# Patient Record
Sex: Female | Born: 1975 | Race: Black or African American | Hispanic: No | Marital: Married | Smoking: Former smoker
Health system: Southern US, Community
[De-identification: ages and names within clinical notes are randomized; demographics above are authoritative.]

## PROBLEM LIST (undated history)

## (undated) DIAGNOSIS — K5900 Constipation, unspecified: Secondary | ICD-10-CM

## (undated) DIAGNOSIS — F32A Depression, unspecified: Secondary | ICD-10-CM

## (undated) DIAGNOSIS — E538 Deficiency of other specified B group vitamins: Secondary | ICD-10-CM

## (undated) DIAGNOSIS — R519 Headache, unspecified: Secondary | ICD-10-CM

## (undated) DIAGNOSIS — R079 Chest pain, unspecified: Secondary | ICD-10-CM

## (undated) DIAGNOSIS — G43019 Migraine without aura, intractable, without status migrainosus: Secondary | ICD-10-CM

## (undated) DIAGNOSIS — T7840XA Allergy, unspecified, initial encounter: Secondary | ICD-10-CM

## (undated) DIAGNOSIS — R600 Localized edema: Secondary | ICD-10-CM

## (undated) DIAGNOSIS — M549 Dorsalgia, unspecified: Secondary | ICD-10-CM

## (undated) DIAGNOSIS — G8929 Other chronic pain: Secondary | ICD-10-CM

## (undated) DIAGNOSIS — E669 Obesity, unspecified: Secondary | ICD-10-CM

## (undated) DIAGNOSIS — F419 Anxiety disorder, unspecified: Secondary | ICD-10-CM

## (undated) DIAGNOSIS — F411 Generalized anxiety disorder: Secondary | ICD-10-CM

## (undated) DIAGNOSIS — G43909 Migraine, unspecified, not intractable, without status migrainosus: Secondary | ICD-10-CM

## (undated) HISTORY — DX: Generalized anxiety disorder: F41.1

## (undated) HISTORY — DX: Obesity, unspecified: E66.9

## (undated) HISTORY — DX: Migraine without aura, intractable, without status migrainosus: G43.019

## (undated) HISTORY — DX: Localized edema: R60.0

## (undated) HISTORY — DX: Depression, unspecified: F32.A

## (undated) HISTORY — DX: Constipation, unspecified: K59.00

## (undated) HISTORY — DX: Allergy, unspecified, initial encounter: T78.40XA

## (undated) HISTORY — DX: Dorsalgia, unspecified: M54.9

## (undated) HISTORY — DX: Headache, unspecified: R51.9

## (undated) HISTORY — DX: Deficiency of other specified B group vitamins: E53.8

## (undated) HISTORY — DX: Anxiety disorder, unspecified: F41.9

## (undated) HISTORY — DX: Other chronic pain: G89.29

## (undated) HISTORY — DX: Migraine, unspecified, not intractable, without status migrainosus: G43.909

## (undated) HISTORY — DX: Chest pain, unspecified: R07.9

---

## 1998-04-02 ENCOUNTER — Other Ambulatory Visit: Admission: RE | Admit: 1998-04-02 | Discharge: 1998-04-02 | Payer: Self-pay | Admitting: Obstetrics and Gynecology

## 1998-09-11 ENCOUNTER — Emergency Department (HOSPITAL_COMMUNITY): Admission: EM | Admit: 1998-09-11 | Discharge: 1998-09-11 | Payer: Self-pay | Admitting: Emergency Medicine

## 1998-09-11 ENCOUNTER — Encounter: Payer: Self-pay | Admitting: Emergency Medicine

## 1999-05-07 ENCOUNTER — Other Ambulatory Visit: Admission: RE | Admit: 1999-05-07 | Discharge: 1999-05-07 | Payer: Self-pay | Admitting: Obstetrics and Gynecology

## 1999-07-21 ENCOUNTER — Other Ambulatory Visit: Admission: RE | Admit: 1999-07-21 | Discharge: 1999-07-21 | Payer: Self-pay | Admitting: Obstetrics & Gynecology

## 1999-07-22 ENCOUNTER — Encounter (INDEPENDENT_AMBULATORY_CARE_PROVIDER_SITE_OTHER): Payer: Self-pay

## 1999-07-22 ENCOUNTER — Other Ambulatory Visit: Admission: RE | Admit: 1999-07-22 | Discharge: 1999-07-22 | Payer: Self-pay | Admitting: Obstetrics & Gynecology

## 2000-06-13 ENCOUNTER — Emergency Department (HOSPITAL_COMMUNITY): Admission: EM | Admit: 2000-06-13 | Discharge: 2000-06-13 | Payer: Self-pay | Admitting: Emergency Medicine

## 2000-06-13 ENCOUNTER — Encounter: Payer: Self-pay | Admitting: Emergency Medicine

## 2000-08-12 ENCOUNTER — Inpatient Hospital Stay (HOSPITAL_COMMUNITY): Admission: AD | Admit: 2000-08-12 | Discharge: 2000-08-12 | Payer: Self-pay | Admitting: Obstetrics & Gynecology

## 2001-10-12 ENCOUNTER — Other Ambulatory Visit: Admission: RE | Admit: 2001-10-12 | Discharge: 2001-10-12 | Payer: Self-pay | Admitting: Obstetrics and Gynecology

## 2002-01-12 ENCOUNTER — Other Ambulatory Visit: Admission: RE | Admit: 2002-01-12 | Discharge: 2002-01-12 | Payer: Self-pay | Admitting: Obstetrics and Gynecology

## 2002-02-27 ENCOUNTER — Encounter: Payer: Self-pay | Admitting: Obstetrics and Gynecology

## 2002-02-27 ENCOUNTER — Ambulatory Visit (HOSPITAL_COMMUNITY): Admission: RE | Admit: 2002-02-27 | Discharge: 2002-02-27 | Payer: Self-pay | Admitting: Obstetrics and Gynecology

## 2002-03-05 ENCOUNTER — Inpatient Hospital Stay (HOSPITAL_COMMUNITY): Admission: AD | Admit: 2002-03-05 | Discharge: 2002-03-05 | Payer: Self-pay | Admitting: Obstetrics and Gynecology

## 2002-03-05 ENCOUNTER — Encounter: Payer: Self-pay | Admitting: Obstetrics and Gynecology

## 2002-03-08 ENCOUNTER — Ambulatory Visit (HOSPITAL_COMMUNITY): Admission: RE | Admit: 2002-03-08 | Discharge: 2002-03-08 | Payer: Self-pay | Admitting: Obstetrics and Gynecology

## 2002-03-21 ENCOUNTER — Inpatient Hospital Stay (HOSPITAL_COMMUNITY): Admission: AD | Admit: 2002-03-21 | Discharge: 2002-03-21 | Payer: Self-pay | Admitting: Obstetrics and Gynecology

## 2002-03-22 ENCOUNTER — Ambulatory Visit (HOSPITAL_COMMUNITY): Admission: RE | Admit: 2002-03-22 | Discharge: 2002-03-22 | Payer: Self-pay | Admitting: Obstetrics and Gynecology

## 2002-03-22 ENCOUNTER — Encounter: Payer: Self-pay | Admitting: Obstetrics and Gynecology

## 2002-05-04 ENCOUNTER — Inpatient Hospital Stay (HOSPITAL_COMMUNITY): Admission: AD | Admit: 2002-05-04 | Discharge: 2002-05-04 | Payer: Self-pay | Admitting: *Deleted

## 2002-05-22 ENCOUNTER — Inpatient Hospital Stay (HOSPITAL_COMMUNITY): Admission: AD | Admit: 2002-05-22 | Discharge: 2002-05-22 | Payer: Self-pay | Admitting: Obstetrics and Gynecology

## 2002-05-23 ENCOUNTER — Inpatient Hospital Stay (HOSPITAL_COMMUNITY): Admission: AD | Admit: 2002-05-23 | Discharge: 2002-05-23 | Payer: Self-pay | Admitting: Obstetrics & Gynecology

## 2002-05-27 ENCOUNTER — Inpatient Hospital Stay (HOSPITAL_COMMUNITY): Admission: AD | Admit: 2002-05-27 | Discharge: 2002-05-27 | Payer: Self-pay | Admitting: Obstetrics and Gynecology

## 2002-06-13 ENCOUNTER — Inpatient Hospital Stay (HOSPITAL_COMMUNITY): Admission: AD | Admit: 2002-06-13 | Discharge: 2002-06-13 | Payer: Self-pay | Admitting: Obstetrics & Gynecology

## 2002-07-08 ENCOUNTER — Inpatient Hospital Stay (HOSPITAL_COMMUNITY): Admission: AD | Admit: 2002-07-08 | Discharge: 2002-07-08 | Payer: Self-pay | Admitting: Obstetrics and Gynecology

## 2002-07-11 ENCOUNTER — Inpatient Hospital Stay (HOSPITAL_COMMUNITY): Admission: AD | Admit: 2002-07-11 | Discharge: 2002-07-11 | Payer: Self-pay | Admitting: Obstetrics and Gynecology

## 2002-07-18 ENCOUNTER — Inpatient Hospital Stay (HOSPITAL_COMMUNITY): Admission: AD | Admit: 2002-07-18 | Discharge: 2002-07-21 | Payer: Self-pay | Admitting: Obstetrics and Gynecology

## 2002-07-22 ENCOUNTER — Encounter: Admission: RE | Admit: 2002-07-22 | Discharge: 2002-08-21 | Payer: Self-pay | Admitting: Obstetrics and Gynecology

## 2002-08-22 ENCOUNTER — Encounter: Admission: RE | Admit: 2002-08-22 | Discharge: 2002-09-21 | Payer: Self-pay | Admitting: *Deleted

## 2003-06-28 ENCOUNTER — Emergency Department (HOSPITAL_COMMUNITY): Admission: AD | Admit: 2003-06-28 | Discharge: 2003-06-28 | Payer: Self-pay | Admitting: Emergency Medicine

## 2006-05-28 ENCOUNTER — Ambulatory Visit: Payer: Self-pay | Admitting: Obstetrics & Gynecology

## 2006-05-28 ENCOUNTER — Observation Stay (HOSPITAL_COMMUNITY): Admission: AD | Admit: 2006-05-28 | Discharge: 2006-05-29 | Payer: Self-pay | Admitting: Obstetrics & Gynecology

## 2009-06-25 ENCOUNTER — Ambulatory Visit (HOSPITAL_COMMUNITY): Admission: RE | Admit: 2009-06-25 | Discharge: 2009-06-26 | Payer: Self-pay | Admitting: Obstetrics and Gynecology

## 2009-06-25 ENCOUNTER — Encounter (INDEPENDENT_AMBULATORY_CARE_PROVIDER_SITE_OTHER): Payer: Self-pay | Admitting: Obstetrics and Gynecology

## 2009-12-20 HISTORY — PX: LAPAROSCOPIC TOTAL HYSTERECTOMY: SUR800

## 2009-12-20 HISTORY — PX: LAPAROSCOPIC HYSTERECTOMY: SHX1926

## 2010-03-06 ENCOUNTER — Emergency Department (HOSPITAL_COMMUNITY): Admission: EM | Admit: 2010-03-06 | Discharge: 2010-03-07 | Payer: Self-pay | Admitting: Emergency Medicine

## 2011-03-12 LAB — CBC
HCT: 43 % (ref 36.0–46.0)
Hemoglobin: 14.1 g/dL (ref 12.0–15.0)
MCHC: 32.8 g/dL (ref 30.0–36.0)
RBC: 4.91 MIL/uL (ref 3.87–5.11)
RDW: 12.6 % (ref 11.5–15.5)

## 2011-03-12 LAB — COMPREHENSIVE METABOLIC PANEL
ALT: 10 U/L (ref 0–35)
Alkaline Phosphatase: 52 U/L (ref 39–117)
BUN: 8 mg/dL (ref 6–23)
CO2: 22 mEq/L (ref 19–32)
Calcium: 9.3 mg/dL (ref 8.4–10.5)
GFR calc non Af Amer: >60 mL/min (ref >60)
Glucose, Bld: 90 mg/dL (ref 70–99)
Potassium: 3.5 mEq/L (ref 3.5–5.1)
Total Protein: 7.5 g/dL (ref 6.0–8.3)

## 2011-03-12 LAB — LIPASE, BLOOD: Lipase: 17 U/L (ref 11–59)

## 2011-03-12 LAB — URINALYSIS, ROUTINE W REFLEX MICROSCOPIC
Ketones, ur: >80 mg/dL — AB
Leukocytes, UA: NEGATIVE
Protein, ur: 30 mg/dL — AB
Urobilinogen, UA: 1 mg/dL (ref 0.0–1.0)

## 2011-03-12 LAB — DIFFERENTIAL
Basophils Relative: 0 % (ref 0–1)
Eosinophils Absolute: 0 10*3/uL (ref 0.0–0.7)
Monocytes Relative: 5 % (ref 3–12)
Neutro Abs: 8.6 10*3/uL — ABNORMAL HIGH (ref 1.7–7.7)
Neutrophils Relative %: 76 % (ref 43–77)

## 2011-03-12 LAB — URINE MICROSCOPIC-ADD ON

## 2011-03-28 LAB — CBC
HCT: 37.8 % (ref 36.0–46.0)
Hemoglobin: 12.5 g/dL (ref 12.0–15.0)
MCHC: 33.2 g/dL (ref 30.0–36.0)
MCHC: 33.4 g/dL (ref 30.0–36.0)
MCV: 84.8 fL (ref 78.0–100.0)
Platelets: 156 10*3/uL (ref 150–400)
Platelets: 207 10*3/uL (ref 150–400)
RDW: 14 % (ref 11.5–15.5)
RDW: 14 % (ref 11.5–15.5)

## 2011-03-28 LAB — PREGNANCY, URINE: Preg Test, Ur: NEGATIVE

## 2011-03-28 LAB — ABO/RH: ABO/RH(D): A POS

## 2011-03-28 LAB — TYPE AND SCREEN: ABO/RH(D): A POS

## 2011-05-04 NOTE — Discharge Summary (Signed)
NAMEMarland Kitchen  Sylvia [name], Sylvia [name]    ACCOUNT NO.:  [id number]   MEDICAL RECORD NO.:  [id number]          PATIENT TYPE:  OIB   LOCATION:  9320                          FACILITY:  [facility]   PHYSICIAN:  Duke Salvia. Marcelle Overlie, M.D.DATE OF BIRTH:  15-Apr-1976   DATE OF ADMISSION:  06/25/2009  DATE OF DISCHARGE:  06/26/2009                               DISCHARGE SUMMARY   DISCHARGE DIAGNOSES:  1. Menorrhagia with dysmenorrhea.  2. Total laparoscopic hysterectomy this admission.   Summary of the history and physical exam, please see admission H and P  for details.  Briefly, a 35 year old, G3, P3, her husband who has had a  vasectomy, with a worsening problem related to menorrhagia and  dysmenorrhea.  Presents now for [facility].   HOSPITAL COURSE:  On June 25, 2009, under general anesthesia, the patient  underwent TLH with an EBL of 200 mL.  On the first postop day, her  catheter was removed.  Her diet was advanced.  Initially, she had  voiding difficulty required I and O cath x1, was discharged later in the  afternoon, postop day #1 after voiding spontaneously several times,  tolerating a regular diet and was afebrile and ready for discharge.   LABORATORY DATA:  Blood type is A positive.  Antibody screen negative.  EPT negative.  CBC on admission, June 24, 2009; WBC 7.5, hemoglobin 12.5,  hematocrit 37.8.  On June 26, 2009; WBC 11.1, hemoglobin 9.6, and  hematocrit 28.6.   DISPOSITION:  The patient was discharged on Tylox p.r.n. pain 1-2 q.4-6  h., Motrin 800 mg 1 p.o. q.8-12 h. p.r.n. pain, will return to the  office in 1 week.  Advised to report any incisional redness or drainage,  increased pain or bleeding, or fever over 101.  Also, if she experiences  dysuria or voiding difficulties, will contact us for evaluation.  She  was given specific instructions regarding diet, sex, exercise, and  activity.   CONDITION:  Good.   ACTIVITY:  Graded increase.      Richard M. Marcelle Overlie, M.D.  Electronically Signed     RMH/MEDQ  D:  06/26/2009  T:  06/27/2009  Job:  [id number]

## 2011-05-04 NOTE — Op Note (Signed)
NAMEMarland [name]  ALICYN, KLANN    ACCOUNT NO.:  [id number]   MEDICAL RECORD NO.:  [id number]           PATIENT TYPE:   LOCATION:                                 FACILITY:   PHYSICIAN:  Duke Salvia. Marcelle Overlie, M.D.    DATE OF BIRTH:   DATE OF PROCEDURE:  DATE OF DISCHARGE:                               OPERATIVE REPORT   PREOPERATIVE DIAGNOSES:  Menorrhagia, dysmenorrhea.   POSTOPERATIVE DIAGNOSES:  Menorrhagia, dysmenorrhea.   PROCEDURE:  Total laparoscopic hysterectomy.   SURGEON:  Duke Salvia. Marcelle Overlie, MD   ANESTHESIA:  General endotracheal.   COMPLICATIONS:  None.   DRAINS:  Foley catheter.   BLOOD LOSS:  200.   SPECIMENS REMOVED:  Uterine fundus and cervix.   PROCEDURE AND FINDINGS:  The patient was taken to the operating room.  After an adequate level of general endotracheal anesthesia was obtained  and the patient legs in stirrups, the abdomen, perineum, and vagina were  prepped and draped in the usual manner for TLH.  A sponge stick was  placed in the vagina.  A Foley catheter positioned, draining clear  urine.  Uterus was mobile, normal size.  Attention directed to the  abdomen.  The subumbilical area was infiltrated with 0.25% Marcaine  plain.  A small incision was made.  The Veress needle was introduced  without difficulty.  Its intra-abdominal position verified by pressure  and water testing.  After 3 L of pneumoperitoneum was then created,  laparoscopic trocar and sleeve were then introduced without difficulty.  After negative transillumination, a left lower and right lower quadrant  10-mm atraumatic trocar was positioned under direct visualization with  the patient in Trendelenburg.   Pelvic findings revealed the uterus to be upper limit in normal size,  mobile, bilateral tubes and ovaries were normal.  The Harmonic Ace was  then used to dissect the utero-ovarian pedicle on each side down to and  including the round ligament.  The anterior peritoneum was then  carried  around to the midline with minimal blunt and sharp dissection of the  bladder flap below.  The ascending branch of the uterine artery on each  side was then coagulated and divided.  The sponge stick was then placed  upright traction to delineate the anterior fornix and with the assistant  placing the uterus on traction from above, the Harmonic Ace was used to  cut down on the sponge performing an anterior culdotomy.  This was  repositioned posteriorly and posterior culdotomy was performed.  Using  the Harmonic Ace, the remainder of the cardinal ligament and vaginal  cuff were coagulated and divided, excising the specimen with good  hemostasis.  The uterus was then removed transvaginally.  A blue towel  was positioned into the vagina to prevent loss of pneumoperitoneum.  Vicryl 0 sutures were then used to close the cuff with full-thickness  bites.  Once this was completed, the pelvis was irrigated with saline  and aspirated.  The operative site was inspected and noted to  be hemostatic.  Interceed was placed across the vaginal cuff.  Instruments were removed, gas allowed to escape.  Defects closed with 4-  0 Dexon  subcuticular sutures and Dermabond.  She tolerated this well,  went to recovery room in good condition.      Richard M. Marcelle Overlie, M.D.  Electronically Signed     RMH/MEDQ  D:  06/25/2009  T:  06/25/2009  Job:  [id number]

## 2011-05-04 NOTE — H&P (Signed)
NAME:  Sylvia [name] ACCOUNT NO.:  [id number]   MEDICAL RECORD NO.:  [id number]           PATIENT TYPE:  AMB   LOCATION:                                [facility]  [facility]   PHYSICIAN:  Duke Salvia. Marcelle Overlie, M.D.DATE OF BIRTH:  01/28/1976   DATE OF ADMISSION:  06/25/2009  DATE OF DISCHARGE:                              HISTORY & PHYSICAL   CHIEF COMPLAINT:  Menorrhagia with dysmenorrhea.   HISTORY OF PRESENT ILLNESS:  A 35 year old, G3, P3.  Her husband has had  a vasectomy.  She has had 3 vaginal deliveries, has had worsening  problems over the last 6-12 months with significant menorrhagia and  dysmenorrhea.  Evaluation in the office, the ultrasound October 2009  showed adnexa unremarkable.  There was no intracavitary mass noted on  saline infusion.  We discussed a number of options ranging from OCPs,  Marina IUD ablation, 2 definitive hysterectomy which is her preference.  TLH in particular reviewed versus LAVH and LSH, she prefers to have TLH.  This procedure including risks related to bleeding, infection, adjacent  organ injury, possible need for open additional surgery all discussed.  Other risks related to phlebitis, wound infection, transfusion risks  reviewed with her also.   PAST MEDICAL HISTORY/ALLERGIES:  Seasonal allergies.   CURRENT MEDICATIONS:  None except for Advil p.r.n.   OBSTETRICAL HISTORY:  Three vaginal deliveries at term.   REVIEW OF SYSTEMS:  Otherwise negative.   FAMILY HISTORY:  Noncontributory.   SOCIAL HISTORY:  Denies alcohol or cigarette use, 1 alcoholic drink per  week.  She does not list a PCP.   PHYSICAL EXAMINATION:  VITAL SIGNS:  Temperature 98.2, blood pressure  120/78.  HEENT: Unremarkable.  NECK:  Supple without masses.  LUNGS:  Clear.  CARDIOVASCULAR:  Regular rate and rhythm without murmurs, rubs, or  gallops noted.  BREASTS:  Without masses.  ABDOMEN:  Soft, flat, nontender.  PELVIC:  Normal external genitalia.  Vagina and  cervix are clear.  Uterus midposition, normal size.  Adnexa negative.   IMPRESSION:  Menorrhagia with dysmenorrhea.   PLAN:  TLH procedure and risks reviewed as above up.      Richard M. Marcelle Overlie, M.D.  Electronically Signed     RMH/MEDQ  D:  06/16/2009  T:  06/17/2009  Job:  [id number]
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
## 2011-05-07 NOTE — Op Note (Signed)
[facility]  Patient:    Sylvia [name], Sylvia [name] Visit Number: [id number] MRN: [id number]          Service Type: DSU Location: [facility] Attending Physician:  Rhina Brackett Dictated by:   Duke Salvia. Marcelle Overlie, M.D. Proc. Date: 03/08/02 Admit Date:  03/08/2002 Discharge Date: 03/08/2002                             Operative Report  PREOPERATIVE DIAGNOSES:       1. Twenty-week intrauterine pregnancy.                               2. Possible incompetent cervix.  POSTOPERATIVE DIAGNOSES:      1. Twenty-week intrauterine pregnancy.                               2. Possible incompetent cervix.  OPERATION:                    McDonald cerclage.  SURGEON:                      Volanda Napoleon, P.A.  ANESTHESIA:                   Spinal.  COMPLICATIONS:                None.  DRAINS:                       In-and-out Foley catheter.  ESTIMATED BLOOD LOSS:         5-10 cc.  DESCRIPTION OF PROCEDURE:     The patient was taken to the operating room. After an adequate level of spinal anesthetic was obtained.  With the patients legs in stirrups, the perineum and vagina were prepped and draped in the usual manner.  The bladder was drained.  A weighted speculum was positioned.  Cervix appeared to be 2 cm long, was gently grasped at the anterior lip with a ring forceps.  A #2 Tevdek suture was then placed in pursestring fashion at the bladder flap juncture anteriorly carrying around posteriorly and tying at the front.  This was cinched down and tied.  This appeared to be an excellent application.  A second suture was not placed.  She tolerated this well.  Went to the recovery room in good condition.  She did receive perioperative antibiotics. Dictated by:   Duke Salvia. Marcelle Overlie, M.D. Attending Physician:  Rhina Brackett DD:  03/08/02 TD:  03/11/02 Job: [id number]

## 2011-05-07 NOTE — H&P (Signed)
[facility]  Patient:    Sylvia [name] Visit Number: [id number] MRN: [id number]          Service Type: Attending:  Duke Salvia. Marcelle Overlie, M.D. Dictated by:   Duke Salvia. Marcelle Overlie, M.D. Adm. Date:  03/08/02                           History and Physical  CHIEF COMPLAINT:              Possible incompetent cervix.  HISTORY OF PRESENT ILLNESS:   A 35 year old G5 P0-1-3-1.  The patient has had two prior TABs, had PPROM in 1996 delivered at 31 and three-sevenths weeks. In 2001 she had an SAB at 17 weeks with a D&E.  She was in our office last week and her scheduled 18-week ultrasound showed a dynamic cervix with some shortening.  This was repeated the following day at [facility] with a cervical length of 4.2.  She presented earlier this week complaining of some left lower quadrant discomfort and cervical length was noted to be 2 cm at [redacted] weeks gestation. Due to the thinning of the cervix and the hypermobility noted on ultrasound, we reviewed McDonald cerclage and she presents for that at this time.  Blood type A positive, rubella titer is positive.  PAST MEDICAL HISTORY:  ALLERGIES:                    None.  OBSTETRICAL HISTORY:          Listed as above.  PHYSICAL EXAMINATION:  VITAL SIGNS:                  Temperature 98.2, blood pressure 108/60.  HEENT:                        Unremarkable.  NECK:                         Supple without masses.  LUNGS:                        Clear.  CARDIOVASCULAR:               Regular rate and rhythm without murmurs, rubs, or gallops noted.  BREASTS:                      Without masses.  ABDOMEN:                      Soft, flat, nontender.  Fundal height was 20 cm. Fetal heart rate 140.  PELVIC:                       Cervix was closed, approximately 2 cm long, membranes intact.  EXTREMITIES AND NEUROLOGIC:   Unremarkable.  IMPRESSION:                   1. Intrauterine pregnancy at 20  weeks.                               2. History of dilatation and evacuation at 17  weeks with the last pregnancy.                               3. Dynamic cervix with some shortening                                  suggestive of a possible incompetent cervix.  PLAN:                         McDonald cervical cerclage.  This procedure including risks of ROM, bleeding, infection have been discussed.  Will plan to give prophylaxis antibiotics. Dictated by:   Duke Salvia. Marcelle Overlie, M.D. Attending:  Duke Salvia. Marcelle Overlie, M.D. DD:  03/07/02 TD:  03/08/02 Job: [id number]

## 2012-12-20 HISTORY — PX: WISDOM TOOTH EXTRACTION: SHX21

## 2013-09-10 ENCOUNTER — Other Ambulatory Visit: Payer: Self-pay | Admitting: Gastroenterology

## 2013-09-10 DIAGNOSIS — R109 Unspecified abdominal pain: Secondary | ICD-10-CM

## 2013-09-17 ENCOUNTER — Ambulatory Visit
Admission: RE | Admit: 2013-09-17 | Discharge: 2013-09-17 | Disposition: A | Discharge status: Home / Self Care | Payer: 59 | Source: Ambulatory Visit | Attending: Gastroenterology | Admitting: Gastroenterology

## 2013-09-17 DIAGNOSIS — R109 Unspecified abdominal pain: Secondary | ICD-10-CM

## 2016-08-25 ENCOUNTER — Ambulatory Visit (INDEPENDENT_AMBULATORY_CARE_PROVIDER_SITE_OTHER): Payer: 59 | Admitting: Neurology

## 2016-08-25 ENCOUNTER — Encounter: Payer: Self-pay | Admitting: Neurology

## 2016-08-25 DIAGNOSIS — G43019 Migraine without aura, intractable, without status migrainosus: Secondary | ICD-10-CM

## 2016-08-25 HISTORY — DX: Migraine without aura, intractable, without status migrainosus: G43.019

## 2016-08-25 MED ORDER — TOPIRAMATE 25 MG PO TABS: ORAL_TABLET | ORAL | 3 refills | Status: DC

## 2016-08-25 MED ORDER — PREDNISONE 5 MG PO TABS: ORAL_TABLET | ORAL | 0 refills | Status: DC

## 2016-08-25 MED ORDER — SUMATRIPTAN SUCCINATE 100 MG PO TABS
100.0000 mg | ORAL_TABLET | Freq: Two times a day (BID) | ORAL | 2 refills | Status: DC | PRN
Start: 2016-08-25 — End: 2016-12-29

## 2016-08-25 NOTE — Patient Instructions (Addendum)
 Topamax (topiramate) is a seizure medication that has an FDA approval for seizures and for migraine headache. Potential side effects of this medication include weight loss, cognitive slowing, tingling in the fingers and toes, and carbonated drinks will taste bad. If any significant side effects are noted on this drug, please contact our office.  Migraine Headache A migraine headache is an intense, throbbing pain on one or both sides of your head. A migraine can last for 30 minutes to several hours. CAUSES  The exact cause of a migraine headache is not always known. However, a migraine may be caused when nerves in the brain become irritated and release chemicals that cause inflammation. This causes pain. Certain things may also trigger migraines, such as:  Alcohol.  Smoking.  Stress.  Menstruation.  Aged cheeses.  Foods or drinks that contain nitrates, glutamate, aspartame, or tyramine.  Lack of sleep.  Chocolate.  Caffeine.  Hunger.  Physical exertion.  Fatigue.  Medicines used to treat chest pain (nitroglycerine), birth control pills, estrogen, and some blood pressure medicines. SIGNS AND SYMPTOMS  Pain on one or both sides of your head.  Pulsating or throbbing pain.  Severe pain that prevents daily activities.  Pain that is aggravated by any physical activity.  Nausea, vomiting, or both.  Dizziness.  Pain with exposure to bright lights, loud noises, or activity.  General sensitivity to bright lights, loud noises, or smells. Before you get a migraine, you may get warning signs that a migraine is coming (aura). An aura may include:  Seeing flashing lights.  Seeing bright spots, halos, or zigzag lines.  Having tunnel vision or blurred vision.  Having feelings of numbness or tingling.  Having trouble talking.  Having muscle weakness. DIAGNOSIS  A migraine headache is often diagnosed based on:  Symptoms.  Physical exam.  A CT scan or MRI of your  head. These imaging tests cannot diagnose migraines, but they can help rule out other causes of headaches. TREATMENT Medicines may be given for pain and nausea. Medicines can also be given to help prevent recurrent migraines.  HOME CARE INSTRUCTIONS  Only take over-the-counter or prescription medicines for pain or discomfort as directed by your health care provider. The use of long-term narcotics is not recommended.  Lie down in a dark, quiet room when you have a migraine.  Keep a journal to find out what may trigger your migraine headaches. For example, write down:  What you eat and drink.  How much sleep you get.  Any change to your diet or medicines.  Limit alcohol consumption.  Quit smoking if you smoke.  Get 7-9 hours of sleep, or as recommended by your health care provider.  Limit stress.  Keep lights dim if bright lights bother you and make your migraines worse. SEEK IMMEDIATE MEDICAL CARE IF:   Your migraine becomes severe.  You have a fever.  You have a stiff neck.  You have vision loss.  You have muscular weakness or loss of muscle control.  You start losing your balance or have trouble walking.  You feel faint or pass out.  You have severe symptoms that are different from your first symptoms. MAKE SURE YOU:   Understand these instructions.  Will watch your condition.  Will get help right away if you are not doing well or get worse.   This information is not intended to replace advice given to you by your health care provider. Make sure you discuss any questions you have with your   health care provider.   Document Released: 12/06/2005 Document Revised: 12/27/2014 Document Reviewed: 08/13/2013 Elsevier Interactive Patient Education 2016 Elsevier Inc.  

## 2016-08-25 NOTE — Progress Notes (Signed)
Reason for visit: Migraine  Referring physician: Dr. Glean Salen [name] is a 40 y.o. female  History of present illness:  Sylvia [name] is a 40 year old right-handed black female with a history of migraine headaches since she was in high school. The patient has had on average of 2 headaches a week since January 2017. The patient began having a headache 10 days ago, the headache has been persistent and daily since that time. The patient has taken a leave of absence from work because of the headache. The headache in general starts in the base of the neck, and goes up to the back of the head associated with some neck stiffness. The headache then will go about the head associated with photophobia, phonophobia, nausea and vomiting. The patient has noted that fluorescent lights or looking at a computer screen may worsen the headaches. She also has some seasonal allergies that may have some influence on her headache frequency. The patient reports no numbness of the extremities, she feels as if the legs may buckle at times with the headache. She has been taking Excedrin Migraine or ibuprofen for the headache which usually is effective, but not recently. The patient does note some cognitive slowing with the headache. She denies any direct family history of migraine, she does have a paternal first cousin with migraine. The patient is sent to this office for an evaluation. She recently was placed on tizanidine for the neck stiffness and headache. She has never been on any daily medication to prevent the headaches.  Past Medical History:  Diagnosis Date  . Anxiety   . Migraine     Past Surgical History:  Procedure Laterality Date  . LAPAROSCOPIC HYSTERECTOMY  2011  . WISDOM TOOTH EXTRACTION Bilateral 2014    Family History  Problem Relation Age of Onset  . Heart attack Father     Social history:  reports that she has quit smoking. She started smoking about 22 years ago. She  has never used smokeless tobacco. She reports that she does not drink alcohol or use drugs.  Medications:  Prior to Admission medications   Not on File      Allergies  Allergen Reactions  . Latex Hives  . Penicillins Hives    ROS:  Out of a complete 14 system review of symptoms, the patient complains only of the following symptoms, and all other reviewed systems are negative.  Fatigue, chest pain Ringing in the ears, dizziness Blurred vision, eye pain Shortness of breath Diarrhea Joint pain, aching muscles Tremor Insomnia  Blood pressure 113/79, pulse 87, height 5\' 3"  (1.6 m), weight 159 lb (72.1 kg).  Physical Exam  General: The patient is alert and cooperative at the time of the examination.  Eyes: Pupils are equal, round, and reactive to light. Discs are flat bilaterally.  Neck: The neck is supple, no carotid bruits are noted.  Respiratory: The respiratory examination is clear.  Cardiovascular: The cardiovascular examination reveals a regular rate and rhythm, no obvious murmurs or rubs are noted.  Neuromuscular: Range of movement of the cervical spine is full, crepitus is noted in the temporomandibular joints.  Skin: Extremities are without significant edema.  Neurologic Exam  Mental status: The patient is alert and oriented x 3 at the time of the examination. The patient has apparent normal recent and remote memory, with an apparently normal attention span and concentration ability.  Cranial nerves: Facial symmetry is present. There is good sensation of the face to pinprick and  soft touch bilaterally. The strength of the facial muscles and the muscles to head turning and shoulder shrug are normal bilaterally. Speech is well enunciated, no aphasia or dysarthria is noted. Extraocular movements are full. Visual fields are full. The tongue is midline, and the patient has symmetric elevation of the soft palate. No obvious hearing deficits are noted.  Motor: The motor  testing reveals 5 over 5 strength of all 4 extremities. Good symmetric motor tone is noted throughout.  Sensory: Sensory testing is intact to pinprick, soft touch, vibration sensation, and position sense on all 4 extremities, with exception of some slight decrease in pinprick sensation on the left arm and leg relative to the right. Position sense of the left hand is decreased. No evidence of extinction is noted.  Coordination: Cerebellar testing reveals good finger-nose-finger and heel-to-shin bilaterally.  Gait and station: Gait is normal. Tandem gait is normal. Romberg is negative. No drift is seen.  Reflexes: Deep tendon reflexes are symmetric and normal bilaterally. Toes are downgoing bilaterally.   Assessment/Plan:  1. Migraine headache, intractable  The patient has had daily headaches over the last 10 days. She will be placed on a prednisone Dosepak. The patient will be started on Topamax, and she will be given Imitrex to take if needed. MRI of the brain will be done. She will follow-up in 3 months. She will contact our office if she continues to do poorly.  Marlan Palau. Keith Willis MD 08/25/2016 9:41 AM  [facility] [address] [place], [place] [id number]  Phone [phone] Fax [phone]

## 2016-09-01 ENCOUNTER — Telehealth: Payer: Self-pay | Admitting: Neurology

## 2016-09-01 NOTE — Telephone Encounter (Signed)
Would you mind calling this patient back and letting her know it is authorized and ready to schedule? I sent referral to [facility]. Their phone number is [phone].

## 2016-09-01 NOTE — Telephone Encounter (Signed)
I advised patient of previous note. She will call [facility] to schedule an MRI.

## 2016-09-01 NOTE — Telephone Encounter (Signed)
Patient is calling stating she saw Dr. Aletta EdouardMy Le,  Opthalmologist today and there were concerns with optic nerve edema. The patient thinks she needs to have an MRI scheduled urgently because of this.

## 2016-09-06 ENCOUNTER — Ambulatory Visit
Admission: RE | Admit: 2016-09-06 | Discharge: 2016-09-06 | Disposition: A | Discharge status: Home / Self Care | Payer: 59 | Source: Ambulatory Visit | Attending: Neurology | Admitting: Neurology

## 2016-09-06 DIAGNOSIS — G43019 Migraine without aura, intractable, without status migrainosus: Secondary | ICD-10-CM | POA: Diagnosis not present

## 2016-09-07 ENCOUNTER — Telehealth: Payer: Self-pay | Admitting: Neurology

## 2016-09-07 DIAGNOSIS — H4710 Unspecified papilledema: Secondary | ICD-10-CM

## 2016-09-07 NOTE — Telephone Encounter (Signed)
I called patient. MRI the brain is normal. The patient was seen by Dr. 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 I will get patient set up for lumbar puncture. The patient is on Topamax.   MRI brain 09/06/16:  IMPRESSION:  This is a normal noncontrasted MRI of the brain.

## 2016-09-08 ENCOUNTER — Telehealth: Payer: Self-pay | Admitting: Neurology

## 2016-09-08 NOTE — Telephone Encounter (Signed)
[facility]   Called and spoke to patient.  I explained LP procedure at length with patient. She was told she had to lay flat for 24 hr after procedure and she stated [facility] the phone. She was worried about this. I explained that if she does not lay flat, she could possibly develop a post-LP headache. She is allowed to use the bathroom but she should lay flat for 24 hr after. If she does get post-LP there are options to try and help with this. Educated patient on what a blood patch was. Advised she will have to have a driver to and from procedure. Explained this procedure was ordered by Dr Anne Hahnwillis because of what Dr Conley RollsLe stated per phone note. She verbalized understanding and appreciation for call. She will call [facility] to schedule appt. She has no further questions. She stated Dr Anne HahnWillis does not need to call. Her questions were answered.

## 2016-09-08 NOTE — Telephone Encounter (Signed)
Patient called, states she stopped by our office yesterday to pick up report and MRI results, states there were no recommendations or mention of spinal tap, will need this for employer.

## 2016-09-08 NOTE — Telephone Encounter (Signed)
Pt called in about Lumbar puncture order. She is also wanting to know if the LP can be done as an inpt. Please call and advise

## 2016-09-08 NOTE — Telephone Encounter (Signed)
The orders for the lumbar puncture came through a telephone note, not through an office visit. I will print the telephone note.

## 2016-10-08 ENCOUNTER — Telehealth: Payer: Self-pay | Admitting: Neurology

## 2016-10-08 ENCOUNTER — Ambulatory Visit
Admission: RE | Admit: 2016-10-08 | Discharge: 2016-10-08 | Disposition: A | Discharge status: Home / Self Care | Payer: 59 | Source: Ambulatory Visit | Attending: Neurology | Admitting: Neurology

## 2016-10-08 ENCOUNTER — Other Ambulatory Visit: Payer: Self-pay | Admitting: Neurology

## 2016-10-08 VITALS — BP 97/57 | HR 67

## 2016-10-08 DIAGNOSIS — G43019 Migraine without aura, intractable, without status migrainosus: Secondary | ICD-10-CM

## 2016-10-08 DIAGNOSIS — H4710 Unspecified papilledema: Secondary | ICD-10-CM

## 2016-10-08 LAB — CSF CELL COUNT WITH DIFFERENTIAL
RBC COUNT CSF: 2 {cells}/uL (ref 0–10)
WBC CSF: 1 {cells}/uL (ref 0–5)

## 2016-10-08 LAB — GLUCOSE, CSF: Glucose, CSF: 59 mg/dL (ref 43–76)

## 2016-10-08 LAB — PROTEIN, CSF: TOTAL PROTEIN, CSF: 27 mg/dL (ref 15–45)

## 2016-10-08 MED ORDER — DIAZEPAM 5 MG PO TABS
10.0000 mg | ORAL_TABLET | Freq: Once | ORAL | Status: AC
  Administered 2016-10-08: 10 mg via ORAL

## 2016-10-08 NOTE — Discharge Instructions (Addendum)

## 2016-10-08 NOTE — Telephone Encounter (Signed)
I called the patient. The lumbar puncture shows a normal opening pressure of 18 cm of water. The patient does not appear to have pseudotumor cerebri, we will treat for migraine headache.

## 2016-10-11 ENCOUNTER — Telehealth: Payer: Self-pay

## 2016-10-11 NOTE — Telephone Encounter (Signed)
Attempted to contact patient to see how she is feeling after her LP here 10/08/16.  Her voice mail box is full, so I was unable to leave her a message. jkl

## 2016-10-12 LAB — CRYPTOCOCCAL AG, LTX SCR RFLX TITER: Cryptococcal Ag Screen: NOT DETECTED

## 2016-10-13 ENCOUNTER — Telehealth: Payer: Self-pay

## 2016-10-13 NOTE — Telephone Encounter (Signed)
Called pt back and discussed spinal fluid results in more detail. Pt questions answered. She has not yet signed up for MyChart and agreed to do this at her next appt. Copies of lab results at front desk for pt pick-up.

## 2016-10-13 NOTE — Telephone Encounter (Signed)
Patient returned Jennifer's call, patient has additional questions/concerns, please call [phone].

## 2016-10-13 NOTE — Telephone Encounter (Signed)
Called pt w/ unremarkable lab results. May call back w/ additional questions/concerns. 

## 2016-10-13 NOTE — Telephone Encounter (Signed)
-----   Message from York Spanielharles K Willis, MD sent at 10/12/2016  5:57 PM EDT ----- Please call the patient, spinal fluid results are unremarkable. ----- Message ----- From: Interface, Lab In Three [facility] Five Sent: 10/08/2016  12:36 PM To: York Spanielharles K Willis, MD

## 2016-11-25 ENCOUNTER — Encounter: Payer: Self-pay | Admitting: Neurology

## 2016-11-25 ENCOUNTER — Ambulatory Visit (INDEPENDENT_AMBULATORY_CARE_PROVIDER_SITE_OTHER): Payer: 59 | Admitting: Neurology

## 2016-11-25 VITALS — BP 116/68 | HR 94 | Ht 63.0 in | Wt 170.2 lb

## 2016-11-25 DIAGNOSIS — H4710 Unspecified papilledema: Secondary | ICD-10-CM

## 2016-11-25 DIAGNOSIS — G43109 Migraine with aura, not intractable, without status migrainosus: Secondary | ICD-10-CM | POA: Diagnosis not present

## 2016-11-25 MED ORDER — METOCLOPRAMIDE HCL 10 MG PO TABS: 10.0000 mg | ORAL_TABLET | Freq: Three times a day (TID) | ORAL | 5 refills | Status: DC | PRN

## 2016-11-25 MED ORDER — DIHYDROERGOTAMINE MESYLATE 4 MG/ML NA SOLN: 1.0000 | NASAL | 12 refills | Status: DC | PRN

## 2016-11-25 MED ORDER — NORTRIPTYLINE HCL 10 MG PO CAPS: ORAL_CAPSULE | ORAL | 5 refills | Status: DC

## 2016-11-25 NOTE — Progress Notes (Signed)
NEUROLOGY CONSULTATION NOTE  Sylvia ShinerDemitra [name] MRN: [id number] DOB: 1976/05/20  Referring provider: Dr. Delorise [name] Primary care provider: Dr. Cain Saupeammie Fulp  Reason for consult:  headaches  Dear Dr Jillyn HiddenFulp:  Thank you for your kind referral of Sylvia [name] for consultation of the above symptoms. Although her history is well known to you, please allow me to reiterate it for the purpose of our medical record. Records and images were personally reviewed where available.  HISTORY OF PRESENT ILLNESS: This is a pleasant 40 year old right-handed woman with a history of anxiety presenting for a second opinion for headaches. She has had a history of migraines since she was 40 years old where she would feel sick to her stomach, however over the past 3 years, headaches have changed, she now had headaches with associated nausea, vomiting, and diarrhea. The headaches are different, she knows now they are coming on, she cannot look at the phone or computer without triggering a headache. Headaches start in the occipital region then radiates forward, starting with a pressure/squeezing sensation, then stabbing pain behind her eyes. She would see floating dots and become sensitive to lights and sounds. She would have profuse diarrhea when the headaches start. She had been taking Ambien and Xanax but weaned off then thinking these were causing the diarrhea. Headaches occur 4-5 times a week lasting for several hours, she would go to a dark room and take Ibuprofen 800mg  which bring the headaches from a 10/10 to 7/10, but still have throbbing and light sensitivity. She cannot drive at night or in direct sunlight, fluorescent lights bother her. She feels her right eye would tear, and hears a long doorbell for 30-45 minutes. When the pressure builds up, she feels her heart beat in her ears that lasts the duration of the headache. She has double vision and feels equilibrium is off with the  headaches. Her neck feels tight. She had seen neurologist Dr. Anne HahnWillis last September and had brain imaging which I personally reviewed. MRI brain without contrast was normal. She saw her optometrist Dr. Conley RollsLe who noted slight blurred disk OU with cup-to-disc ratio of 0.4/0.4 OD and 0.4/0.4 OS. She had a lumbar puncture with normal opening pressure of 18 cm H2O in left lateral decubitus position. She was given a Prednisone dosepak and started on Topamax, currently on 75mg  qhs, but states that after taking it for 2-1/2 weeks, she started feeling suicidal. She is still having suicidal thoughts, no plan, and is now seeing a therapist. She denies any prior history of depression or suicidal ideation. She has 3 children and has been on leave from work due to the headaches since 08/11/16. She also notices tingling in her fingers and bottom of feet since starting Topamax. She denies any falls, no back pain, bowel/bladder dysfunction. She usually gets 4-5 hours of sleep. Her paternal cousin has migraines. She has not tried any other migraine medications.   PAST MEDICAL HISTORY: Past Medical History:  Diagnosis Date  . Anxiety   . Common migraine with intractable migraine 08/25/2016  . Migraine     PAST SURGICAL HISTORY: Past Surgical History:  Procedure Laterality Date  . LAPAROSCOPIC HYSTERECTOMY  2011  . WISDOM TOOTH EXTRACTION Bilateral 2014    MEDICATIONS:  Outpatient Encounter Prescriptions as of 11/25/2016  Medication Sig  . ALPRAZolam (XANAX) 0.25 MG tablet Take 0.25 mg by mouth at bedtime as needed for anxiety.  . diphenhydrAMINE (BENADRYL) 25 MG tablet Take 25 mg by mouth every 6 (six) hours  as needed.  Marland Kitchen ibuprofen (ADVIL,MOTRIN) 200 MG tablet Take 200 mg by mouth every 6 (six) hours as needed.  . predniSONE (DELTASONE) 5 MG tablet Begin taking 6 tablets daily, taper by one tablet daily until off the medication.  . SUMAtriptan (IMITREX) 100 MG tablet Take 1 tablet (100 mg total) by mouth 2 (two) times  daily as needed for migraine.  Marland Kitchen zolpidem (AMBIEN) 5 MG tablet Take 5 mg by mouth at bedtime as needed for sleep.  . [DISCONTINUED] topiramate (TOPAMAX) 25 MG tablet Take one tablet at night for one week, then take 2 tablets at night for one week, then take 3 tablets at night.  . dihydroergotamine (MIGRANAL) 4 MG/ML nasal spray Place 1 spray into the nose as needed for migraine. Use in one nostril as directed.  No more than 4 sprays in one hour  . metoCLOPramide (REGLAN) 10 MG tablet Take 1 tablet (10 mg total) by mouth every 8 (eight) hours as needed for nausea or vomiting.  . nortriptyline (PAMELOR) 10 MG capsule Take 1 capsule at night for 1 week, then increase to 2 capsules at night for 1 week, then increase to 3 capsules at night and continue   No facility-administered encounter medications on file as of 11/25/2016.     ALLERGIES: Allergies  Allergen Reactions  . Latex Hives  . Penicillins Hives    FAMILY HISTORY: Family History  Problem Relation Age of Onset  . Heart attack Father     SOCIAL HISTORY: Social History   Social History  . Marital status: Married    Spouse name: N/A  . Number of children: 4  . Years of education: College   Occupational History  . [employer]     Social History Main Topics  . Smoking status: Former Smoker    Start date: 08/25/1994  . Smokeless tobacco: Never Used  . Alcohol use No     Comment: Quit 12/2015  . Drug use: No  . Sexual activity: Not on file     Comment: Married   Other Topics Concern  . Not on file   Social History Narrative   Lives at home w/ husband and 3 children, also has 1 step-child   Right-handed   Caffeine: none since 05/2016    REVIEW OF SYSTEMS: Constitutional: No fevers, chills, or sweats, no generalized fatigue, change in appetite Eyes: No visual changes, double vision, eye pain Ear, nose and throat: No hearing loss, ear pain, nasal congestion, sore throat Cardiovascular: No chest pain, palpitations Respiratory:   No shortness of breath at rest or with exertion, wheezes GastrointestinaI: No nausea, vomiting, diarrhea, abdominal pain, fecal incontinence Genitourinary:  No dysuria, urinary retention or frequency Musculoskeletal:  + neck pain, no back pain Integumentary: No rash, pruritus, skin lesions Neurological: as above Psychiatric: No depression, insomnia, anxiety Endocrine: No palpitations, fatigue, diaphoresis, mood swings, change in appetite, change in weight, increased thirst Hematologic/Lymphatic:  No anemia, purpura, petechiae. Allergic/Immunologic: no itchy/runny eyes, nasal congestion, recent allergic reactions, rashes  PHYSICAL EXAM: Vitals:   11/25/16 1046  BP: 116/68  Pulse: 94   General: No acute distress Head:  Normocephalic/atraumatic Eyes: Fundoscopic exam shows bilateral sharp discs, no vessel changes, exudates, or hemorrhages Neck: supple, no paraspinal tenderness, full range of motion Back: No paraspinal tenderness Heart: regular rate and rhythm Lungs: Clear to auscultation bilaterally. Vascular: No carotid bruits. Skin/Extremities: No rash, no edema Neurological Exam: Mental status: alert and oriented to person, place, and time, no dysarthria or aphasia, [employer]  of knowledge is appropriate.  Recent and remote memory are intact.  Attention and concentration are normal.    Able to name objects and repeat phrases. Cranial nerves: CN I: not tested CN II: pupils equal, round and reactive to light, visual fields intact, fundi unremarkable. CN III, IV, VI:  full range of motion, no nystagmus, no ptosis CN V: facial sensation intact CN VII: upper and lower face symmetric CN VIII: hearing intact to finger rub CN IX, X: gag intact, uvula midline CN XI: sternocleidomastoid and trapezius muscles intact CN XII: tongue midline Bulk & Tone: normal, no fasciculations. Motor: 5/5 throughout with no pronator drift. Sensation: intact to light touch, cold, pin, vibration and joint  position sense.  No extinction to double simultaneous stimulation.  Romberg test negative Deep Tendon Reflexes: +2 throughout, no ankle clonus Plantar responses: downgoing bilaterally Cerebellar: no incoordination on finger to nose, heel to shin. No dysdiadochokinesia Gait: narrow-based and steady, able to tandem walk adequately. Tremor: none  IMPRESSION: This is a 40 year old right-handed woman with a history of anxiety and migraines since childhood, presenting with a change in headaches, now with associated nausea, vomiting, and diarrhea. MRI brain normal. Her eye exam at her optometrist office showed slight blurred disks bilaterally, lumbar puncture showed normal opening pressure of 18 cm H2O. She was started on Topamax for headache prophylaxis, which also helps with idiopathic intracranial hypertension, however she is having side effects on this. With normal opening pressure, I am hesitant to start Diamox. She will be referred for confirmation of optic nerve edema with ophthalmology. If present, we will start Diamox. In the meantime, we discussed starting a different headache preventative medication, she will start nortriptyline and uptitrate as tolerated. This may help with sleep and mood as well. Side effects were discussed. She will start tapering off the Topamax. She was given a sample of Migranal to take at the onset of migraine. Reglan 10mg  prn for nausea sent in as well. She was advised to minimize rescue medication to 2-3 times a week to avoid rebound headaches. She will follow-up in 2 months and knows to call for any changes.   Thank you for allowing me to participate in the care of this patient. Please do not hesitate to call for any questions or concerns.   Patrcia DollyKaren Walaa Carel, M.D.  CC: Dr. Jillyn HiddenFulp

## 2016-11-25 NOTE — Patient Instructions (Signed)
1. Start nortriptyline 10mg : Take 1 capsule at night for 1 week, then increase to 2 capsules at night for 1 week, then increase to 3 capsules at night and continue 2. Take Reglan 10mg  up to every 8 hours as needed for nausea 3. Try Migranal nasal spray at onset of migraine, let us know if helpful and we will send refills.  4. Refer to Ophthalmology for confirmation of report of optic nerve edema 5. Start tapering Topamax to 2 tabs at night for 1 week, then 1 tab at night for 1 week, then stop 6. Minimize intake of ibuprofen, tylenol, or any other rescue medication to 2-3 a week, otherwise headaches may worsen 7. Follow-up in 2 months, call for any changes

## 2016-11-29 ENCOUNTER — Ambulatory Visit: Payer: 59 | Admitting: Nurse Practitioner

## 2016-11-30 DIAGNOSIS — G43109 Migraine with aura, not intractable, without status migrainosus: Secondary | ICD-10-CM | POA: Insufficient documentation

## 2016-12-29 ENCOUNTER — Encounter (INDEPENDENT_AMBULATORY_CARE_PROVIDER_SITE_OTHER): Payer: Self-pay

## 2016-12-29 ENCOUNTER — Encounter (HOSPITAL_COMMUNITY): Payer: Self-pay | Admitting: Emergency Medicine

## 2016-12-29 ENCOUNTER — Emergency Department (HOSPITAL_COMMUNITY)
Admission: EM | Admit: 2016-12-29 | Discharge: 2016-12-29 | Disposition: A | Discharge status: Home / Self Care | Payer: 59 | Attending: Emergency Medicine | Admitting: Emergency Medicine

## 2016-12-29 ENCOUNTER — Ambulatory Visit (INDEPENDENT_AMBULATORY_CARE_PROVIDER_SITE_OTHER): Payer: 59 | Admitting: Cardiology

## 2016-12-29 ENCOUNTER — Emergency Department (HOSPITAL_COMMUNITY): Payer: 59

## 2016-12-29 ENCOUNTER — Encounter: Payer: Self-pay | Admitting: Cardiology

## 2016-12-29 VITALS — BP 118/70 | HR 74 | Ht 63.0 in | Wt 168.0 lb

## 2016-12-29 DIAGNOSIS — R0602 Shortness of breath: Secondary | ICD-10-CM | POA: Diagnosis not present

## 2016-12-29 DIAGNOSIS — R0789 Other chest pain: Secondary | ICD-10-CM

## 2016-12-29 DIAGNOSIS — R079 Chest pain, unspecified: Secondary | ICD-10-CM | POA: Insufficient documentation

## 2016-12-29 DIAGNOSIS — Z5321 Procedure and treatment not carried out due to patient leaving prior to being seen by health care provider: Secondary | ICD-10-CM | POA: Insufficient documentation

## 2016-12-29 LAB — COMPREHENSIVE METABOLIC PANEL
ALT: 11 U/L — ABNORMAL LOW (ref 14–54)
ANION GAP: 6 (ref 5–15)
AST: 17 U/L (ref 15–41)
Albumin: 4.2 g/dL (ref 3.5–5.0)
Alkaline Phosphatase: 43 U/L (ref 38–126)
BUN: 9 mg/dL (ref 6–20)
CHLORIDE: 109 mmol/L (ref 101–111)
CO2: 23 mmol/L (ref 22–32)
Calcium: 9.2 mg/dL (ref 8.9–10.3)
Creatinine, Ser: 0.88 mg/dL (ref 0.44–1.00)
GFR calc Af Amer: >60 mL/min (ref >60)
Glucose, Bld: 94 mg/dL (ref 65–99)
POTASSIUM: 3.6 mmol/L (ref 3.5–5.1)
Sodium: 138 mmol/L (ref 135–145)
TOTAL PROTEIN: 7 g/dL (ref 6.5–8.1)
Total Bilirubin: 0.4 mg/dL (ref 0.3–1.2)

## 2016-12-29 LAB — CBC WITH DIFFERENTIAL/PLATELET
BASOS ABS: 0 10*3/uL (ref 0.0–0.1)
BASOS PCT: 0 %
EOS PCT: 1 %
Eosinophils Absolute: 0.1 10*3/uL (ref 0.0–0.7)
HCT: 40.7 % (ref 36.0–46.0)
Hemoglobin: 13.2 g/dL (ref 12.0–15.0)
LYMPHS PCT: 40 %
Lymphs Abs: 3.7 10*3/uL (ref 0.7–4.0)
MCH: 27.7 pg (ref 26.0–34.0)
MCHC: 32.4 g/dL (ref 30.0–36.0)
MCV: 85.5 fL (ref 78.0–100.0)
MONO ABS: 0.6 10*3/uL (ref 0.1–1.0)
MONOS PCT: 7 %
Neutro Abs: 4.8 10*3/uL (ref 1.7–7.7)
Neutrophils Relative %: 52 %
PLATELETS: 245 10*3/uL (ref 150–400)
RBC: 4.76 MIL/uL (ref 3.87–5.11)
RDW: 12.6 % (ref 11.5–15.5)
WBC: 9.2 10*3/uL (ref 4.0–10.5)

## 2016-12-29 LAB — I-STAT TROPONIN, ED: TROPONIN I, POC: 0 ng/mL (ref 0.00–0.08)

## 2016-12-29 NOTE — Progress Notes (Signed)
Cardiology Office Note   Date:  12/29/2016   ID:  Sylvia [name], DOB 11-12-1976, MRN [id number]  Referring Doctor:  Cain Saupe, MD   Cardiologist:   Almond Lint, MD   Reason for consultation:  Chief Complaint  Patient presents with  . other    Self ref for face tingling with chest pressure. Meds reviewed by the pt. verbally. Pt. was just at [facility]. Meds reviewed by the pt's bottles.   Chest pain    History of Present Illness: Sylvia [name] is a 41 y.o. female who presents for Chest pain 1 week history of chest pain. This is located mainly upper chest near the left shoulder, also on the left flank. This has been going on for about a week now. Moderate in intensity, almost continuous since that time. She also has some neck pain and maybe tingling in the lip and up per face. She does not know exactly what brings on the pain. She also mentions shortness of breath when the chest pain occurs. She is very worried knowing that her father has a history of coronary artery disease diagnosed recently, age 45. Because it was not going anywhere, she decided to go to the ER and she was actually there around midnight but left at around 5:00. They're able to do one troponin level.  Patient mentions that she has a history of migraine headaches. The pain in the neck area feels like a migraine but this is the first time she's really had chest pain.  No fever, cough, colds, abdominal pain. No PND, orthopnea, edema.   ROS:  Please see the history of present illness. Aside from mentioned under HPI, all other systems are reviewed and negative.     Past Medical History:  Diagnosis Date  . Anxiety   . Common migraine with intractable migraine 08/25/2016  . Migraine     Past Surgical History:  Procedure Laterality Date  . LAPAROSCOPIC HYSTERECTOMY  2011  . WISDOM TOOTH EXTRACTION Bilateral 2014     reports that she has quit smoking. She started smoking about 22 years ago.  She has never used smokeless tobacco. She reports that she does not drink alcohol or use drugs.   family history includes Heart attack in her father; Heart disease in her father.   Outpatient Medications Prior to Visit  Medication Sig Dispense Refill  . ALPRAZolam (XANAX) 0.25 MG tablet Take 0.25 mg by mouth at bedtime as needed for anxiety.    . dihydroergotamine (MIGRANAL) 4 MG/ML nasal spray Place 1 spray into the nose as needed for migraine. Use in one nostril as directed.  No more than 4 sprays in one hour 8 mL 12  . diphenhydrAMINE (BENADRYL) 25 MG tablet Take 25 mg by mouth every 6 (six) hours as needed.    Marland Kitchen ibuprofen (ADVIL,MOTRIN) 200 MG tablet Take 200 mg by mouth every 6 (six) hours as needed.    . metoCLOPramide (REGLAN) 10 MG tablet Take 1 tablet (10 mg total) by mouth every 8 (eight) hours as needed for nausea or vomiting. 30 tablet 5  . zolpidem (AMBIEN) 5 MG tablet Take 5 mg by mouth at bedtime as needed for sleep.    . nortriptyline (PAMELOR) 10 MG capsule Take 1 capsule at night for 1 week, then increase to 2 capsules at night for 1 week, then increase to 3 capsules at night and continue 90 capsule 5  . predniSONE (DELTASONE) 5 MG tablet Begin taking 6 tablets daily, taper  by one tablet daily until off the medication. (Patient not taking: Reported on 12/29/2016) 21 tablet 0  . SUMAtriptan (IMITREX) 100 MG tablet Take 1 tablet (100 mg total) by mouth 2 (two) times daily as needed for migraine. (Patient not taking: Reported on 12/29/2016) 10 tablet 2   No facility-administered medications prior to visit.      Allergies: Latex and Penicillins    PHYSICAL EXAM: VS:  BP 118/70 (BP Location: Right Arm, Patient Position: Sitting, Cuff Size: Normal)   Pulse 74   Ht 5\' 3"  (1.6 m)   Wt 168 lb (76.2 kg)   BMI 29.76 kg/m  , Body mass index is 29.76 kg/m. Wt Readings from Last 3 Encounters:  12/29/16 168 lb (76.2 kg)  12/29/16 168 lb 2 oz (76.3 kg)  11/25/16 170 lb 3 oz (77.2 kg)     GENERAL:  well developed, well nourished, not in acute distress HEENT: normocephalic, pink conjunctivae, anicteric sclerae, no xanthelasma, normal dentition, oropharynx clear NECK:  no neck vein engorgement, JVP normal, no hepatojugular reflux, carotid upstroke brisk and symmetric, no bruit, no thyromegaly, no lymphadenopathy LUNGS:  good respiratory effort, clear to auscultation bilaterally CV:  PMI not displaced, no thrills, no lifts, S1 and S2 within normal limits, no palpable S3 or S4, no murmurs, no rubs, no gallops ABD:  Soft, nontender, nondistended, normoactive bowel sounds, no abdominal aortic bruit, no hepatomegaly, no splenomegaly MS: nontender back, no kyphosis, no scoliosis, no joint deformities EXT:  2+ DP/PT pulses, no edema, no varicosities, no cyanosis, no clubbing SKIN: warm, nondiaphoretic, normal turgor, no ulcers NEUROPSYCH: alert, oriented to person, place, and time, sensory/motor grossly intact, normal mood, appropriate affect  Recent Labs: 12/29/2016: ALT 11; BUN 9; Creatinine, Ser 0.88; Hemoglobin 13.2; Platelets 245; Potassium 3.6; Sodium 138   Lipid Panel No results found for: CHOL, TRIG, HDL, CHOLHDL, VLDL, LDLCALC, LDLDIRECT   Other studies Reviewed:  EKG:  The ekg from 12/29/2016 was personally reviewed by me and it revealed sinus rhythm, 74 BPM. Baseline wander.  Additional studies/ records that were reviewed personally reviewed by me today include: None available   ASSESSMENT AND PLAN:  Chest pain, SOB Atypical, not really exertional. There may be reproducible component to her pain Again, she is very worried about the family history of heart disease. Recommend troponin. Recommend stress echocardiogram for evaluation. Also for reassurance. No ischemia, recommend to try NSAIDs. The musculoskeletal.  Current medicines are reviewed at length with the patient today.  The patient does not have concerns regarding medicines.  Labs/ tests ordered today  include:  Orders Placed This Encounter  Procedures  . EKG 12-Lead    I had a lengthy and detailed discussion with the patient regarding diagnoses, prognosis, diagnostic options.  I counseled the patient on importance of lifestyle modification including heart healthy diet, regular physical activity .   Disposition:   FU with undersigned after tests prn  Signed, Almond LintAileen Leanor Voris, MD  12/29/2016 12:07 PM    [facility]  This note was generated in part with voice recognition software and I apologize for any typographical errors that were not detected and corrected.

## 2016-12-29 NOTE — Patient Instructions (Addendum)
Labwork: Labs done today and we will call you with results.   Testing/Procedures: Your physician has requested that you have a stress echocardiogram. For further information please visit [email or web address]. Please follow instruction sheet as given.   Do not drink or eat foods with caffeine for 24 hours before the test. (Chocolate, coffee, tea, or energy drinks)  If you use an inhaler, bring it with you to the test.  Do not smoke for 4 hours before the test.  Wear comfortable shoes and clothing.  Follow-Up: Your physician recommends that you schedule a follow-up appointment as needed with Dr. Alvino ChapelIngal. We will call you with results and if needed schedule follow up at that time.   It was a pleasure seeing you today here in the office. Please do not hesitate to give us a call back if you have any further questions. [phone]  Ainaloa CellarPamela A. RN, BSN     Exercise Stress Echocardiogram An exercise stress echocardiogram is a test to check how well your heart is working. This test uses sound waves (ultrasound) and a computer to make images of your heart before and after exercise. Ultrasound images that are taken before you exercise (your resting echocardiogram) will show how much blood is getting to your heart muscle and how well your heart muscle and heart valves are functioning. During the next part of this test, you will walk on a treadmill or ride a stationary bike to see how exercise affects your heart. While you exercise, the electrical activity of your heart will be monitored with an electrocardiogram (ECG). Your blood pressure will also be monitored. You may have this test if you:  Have chest pain or other symptoms of a heart problem.  Recently had a heart attack or heart surgery.  Have heart valve problems.  Have a condition that causes narrowing of the blood vessels that supply your heart (coronary artery disease).  Have a high risk of heart disease and are starting a new  exercise program.  Have a high risk of heart disease and need to have major surgery. Tell a health care provider about:  Any allergies you have.  All medicines you are taking, including vitamins, herbs, eye drops, creams, and over-the-counter medicines.  Any problems you or family members have had with anesthetic medicines.  Any blood disorders you have.  Any surgeries you have had.  Any medical conditions you have.  Whether you are pregnant or may be pregnant. What are the risks? Generally, this is a safe procedure. However, problems may occur, including:  Chest pain.  Dizziness or light-headedness.  Shortness of breath.  Increased or irregular heartbeat (palpitations).  Nausea or vomiting.  Heart attack (very rare). What happens before the procedure?  Follow instructions from your health care provider about eating or drinking restrictions. You may be asked to avoid all forms of caffeine for 24 hours before your procedure, or as told by your health care provider.  Ask your health care provider about changing or stopping your regular medicines. This is especially important if you are taking diabetes medicines or blood thinners.  If you use an inhaler, bring it with you to the test.  Wear loose, comfortable clothing and walking shoes.  Do notuse any products that contain nicotine or tobacco, such as cigarettes and e-cigarettes, for 4 hours before the test or as told by your health care provider. If you need help quitting, ask your health care provider. What happens during the procedure?  You will take off your  clothes from the waist up and put on a hospital gown.  A technician will place electrodes on your chest.  A blood pressure cuff will be placed on your arm.  You will lie down on a table for an ultrasound exam before you exercise. Gel will be rubbed on your chest, and a handheld device (transducer) will be pressed against your chest and moved over your  heart.  Then, you will start exercising by walking on a treadmill or pedaling a stationary bicycle.  Your blood pressure and heart rhythm will be monitored while you exercise.  The exercise will gradually get harder or faster.  You will exercise until:  Your heart reaches a target level.  You are too tired to continue.  You cannot continue because of chest pain, weakness, or dizziness.  You will have another ultrasound exam after you stop exercising. The procedure may vary among health care providers and hospitals. What happens after the procedure?  Your heart rate and blood pressure will be monitored until they return to your normal levels. Summary  An exercise stress echocardiogram is a test that uses ultrasound to check how well your heart works before and after exercise.  Before the test, follow instructions from your health care provider about stopping medications, avoiding nicotine and tobacco, and avoiding certain foods and drinks.  During the test, your blood pressure and heart rhythm will be monitored while you exercise on a treadmill or stationary bicycle. This information is not intended to replace advice given to you by your health care provider. Make sure you discuss any questions you have with your health care provider. Document Released: 12/10/2004 Document Revised: 07/28/2016 Document Reviewed: 07/28/2016 Elsevier Interactive Patient Education  2017 [employer].

## 2016-12-29 NOTE — ED Triage Notes (Signed)
Pt st;s she has had left upper chest pain off and on x's 2-3 days.  St's tonight pain started and hasn't stopped.  Pt also st's she has had shortness of breath, dizziness, nausea and weakness

## 2016-12-30 LAB — TROPONIN I: Troponin I: <0.01 ng/mL (ref 0.00–0.04)

## 2017-01-19 ENCOUNTER — Other Ambulatory Visit: Payer: 59

## 2017-01-24 ENCOUNTER — Ambulatory Visit: Payer: 59 | Admitting: Cardiovascular Disease

## 2017-01-26 ENCOUNTER — Telehealth: Payer: Self-pay | Admitting: Neurology

## 2017-01-26 NOTE — Telephone Encounter (Signed)
Left VM for patient that [phone]

## 2017-01-26 NOTE — Telephone Encounter (Signed)
Patient needs to know the name of the ophthalmology office we were referring her to please leave the name and phone number on her voice mail please call [phone]

## 2017-01-31 ENCOUNTER — Other Ambulatory Visit (INDEPENDENT_AMBULATORY_CARE_PROVIDER_SITE_OTHER): Payer: 59

## 2017-01-31 ENCOUNTER — Encounter: Payer: Self-pay | Admitting: Neurology

## 2017-01-31 ENCOUNTER — Ambulatory Visit (INDEPENDENT_AMBULATORY_CARE_PROVIDER_SITE_OTHER): Payer: 59 | Admitting: Neurology

## 2017-01-31 VITALS — BP 116/70 | HR 83 | Ht 63.0 in | Wt 177.6 lb

## 2017-01-31 DIAGNOSIS — R202 Paresthesia of skin: Secondary | ICD-10-CM | POA: Diagnosis not present

## 2017-01-31 DIAGNOSIS — G43109 Migraine with aura, not intractable, without status migrainosus: Secondary | ICD-10-CM

## 2017-01-31 LAB — VITAMIN B12: Vitamin B-12: 160 pg/mL — ABNORMAL LOW (ref 211–911)

## 2017-01-31 LAB — TSH: TSH: 1.13 u[IU]/mL (ref 0.35–4.50)

## 2017-01-31 LAB — SEDIMENTATION RATE: SED RATE: 1 mm/h (ref 0–20)

## 2017-01-31 MED ORDER — NORTRIPTYLINE HCL 25 MG PO CAPS: ORAL_CAPSULE | ORAL | 6 refills | Status: DC

## 2017-01-31 NOTE — Patient Instructions (Signed)
1. Increase nortriptyline to 36m at night for 2 weeks, then may increase to 75mat night if still having frequent headaches 2. Bloodwork for TSH, B12, ESR, ANA, B1 3. Keep a calendar of your migraines, follow-up in 3 months, call for any changes

## 2017-01-31 NOTE — Progress Notes (Signed)
NEUROLOGY FOLLOW UP OFFICE NOTE  Sylvia [name] [id number]  HISTORY OF PRESENT ILLNESS: I had the pleasure of seeing Sylvia [name] in follow-up in the neurology clinic on 01/31/2017.  The patient was last seen 2 months ago for worsening headaches. Records and images were personally reviewed where available.  She was noted to have blurred disks OU at her optometrist office. She had an LP with normal opening pressure. She has been evaluated by ophthalmologist Dr. Kathlen Mody, there was no optic disc edema seen, unlikely to have IIH. She was started on nortriptyline on her last visit, currently at 20m qhs with minimal side effects (drowsiness at night, none during daytime). She still has 4-5 migraines a week, but they do not last as long. The nasal spray does help but the effects do not last. She is feeling much better off the Topamax, but is still having tingling on her face and fingers (less but still noticeable). She denies any neck pain, no bowel/bladder dysfunction.   HPI 11/25/2016: This is a pleasant 41yo RH woman with a history of anxiety who presented for second opinion. She has had a history of migraines since she was 41years old where she would feel sick to her stomach, however over the past 3 years, headaches have changed, she now had headaches with associated nausea, vomiting, and diarrhea. The headaches are different, she knows now they are coming on, she cannot look at the phone or computer without triggering a headache. Headaches start in the occipital region then radiates forward, starting with a pressure/squeezing sensation, then stabbing pain behind her eyes. She would see floating dots and become sensitive to lights and sounds. She would have profuse diarrhea when the headaches start. She had been taking Ambien and Xanax but weaned off then thinking these were causing the diarrhea. Headaches occur 4-5 times a week lasting for several hours, she would go to a dark room  and take Ibuprofen 8090mwhich bring the headaches from a 10/10 to 7/10, but still have throbbing and light sensitivity. She cannot drive at night or in direct sunlight, fluorescent lights bother her. She feels her right eye would tear, and hears a long doorbell for 30-45 minutes. When the pressure builds up, she feels her heart beat in her ears that lasts the duration of the headache. She has double vision and feels equilibrium is off with the headaches. Her neck feels tight. She had seen neurologist Dr. WiJannifer Franklinast September and had brain imaging which I personally reviewed. MRI brain without contrast was normal. She saw her optometrist Dr. LeMarin Commentho noted slight blurred disk OU with cup-to-disc ratio of 0.4/0.4 OD and 0.4/0.4 OS. She had a lumbar puncture with normal opening pressure of 18 cm H2O in left lateral decubitus position. She was given a Prednisone dosepak and started on Topamax, currently on 7557mhs, but states that after taking it for 2-1/2 weeks, she started feeling suicidal. She is still having suicidal thoughts, no plan, and is now seeing a therapist. She denies any prior history of depression or suicidal ideation. She has 3 children and has been on leave from work due to the headaches since 08/11/16. She also notices tingling in her fingers and bottom of feet since starting Topamax. She denies any falls, no back pain, bowel/bladder dysfunction. She usually gets 4-5 hours of sleep. Her paternal cousin has migraines. She has not tried any other migraine medications  PAST MEDICAL HISTORY: Past Medical History:  Diagnosis Date  . Anxiety   .  Common migraine with intractable migraine 08/25/2016  . Migraine     MEDICATIONS: Current Outpatient Prescriptions on File Prior to Visit  Medication Sig Dispense Refill  . ALPRAZolam (XANAX) 0.25 MG tablet Take 0.25 mg by mouth at bedtime as needed for anxiety.    . citalopram (CELEXA) 20 MG tablet Take 10 mg by mouth daily.     Marland Kitchen dihydroergotamine  (MIGRANAL) 4 MG/ML nasal spray Place 1 spray into the nose as needed for migraine. Use in one nostril as directed.  No more than 4 sprays in one hour 8 mL 12  . diphenhydrAMINE (BENADRYL) 25 MG tablet Take 25 mg by mouth every 6 (six) hours as needed.    Marland Kitchen ibuprofen (ADVIL,MOTRIN) 200 MG tablet Take 200 mg by mouth every 6 (six) hours as needed.    . metoCLOPramide (REGLAN) 10 MG tablet Take 1 tablet (10 mg total) by mouth every 8 (eight) hours as needed for nausea or vomiting. 30 tablet 5  . nortriptyline (PAMELOR) 10 MG capsule Take 10 mg by mouth at bedtime.    Marland Kitchen zolpidem (AMBIEN) 5 MG tablet Take 5 mg by mouth at bedtime as needed for sleep.     No current facility-administered medications on file prior to visit.     ALLERGIES: Allergies  Allergen Reactions  . Latex Hives  . Penicillins Hives    FAMILY HISTORY: Family History  Problem Relation Age of Onset  . Heart attack Father   . Heart disease Father     SOCIAL HISTORY: Social History   Social History  . Marital status: Married    Spouse name: N/A  . Number of children: 4  . Years of education: College   Occupational History  . [employer]     Social History Main Topics  . Smoking status: Former Smoker    Start date: 08/25/1994  . Smokeless tobacco: Never Used  . Alcohol use No     Comment: Quit 12/2015  . Drug use: No  . Sexual activity: Not on file     Comment: Married   Other Topics Concern  . Not on file   Social History Narrative   Lives at home w/ husband and 3 children, also has 1 step-child   Right-handed   Caffeine: none since 05/2016    REVIEW OF SYSTEMS: Constitutional: No fevers, chills, or sweats, no generalized fatigue, change in appetite Eyes: No visual changes, double vision, eye pain Ear, nose and throat: No hearing loss, ear pain, nasal congestion, sore throat Cardiovascular: No chest pain, palpitations Respiratory:  No shortness of breath at rest or with exertion, wheezes GastrointestinaI: No  nausea, vomiting, diarrhea, abdominal pain, fecal incontinence Genitourinary:  No dysuria, urinary retention or frequency Musculoskeletal:  No neck pain, back pain Integumentary: No rash, pruritus, skin lesions Neurological: as above Psychiatric: No depression, insomnia, anxiety Endocrine: No palpitations, fatigue, diaphoresis, mood swings, change in appetite, change in weight, increased thirst Hematologic/Lymphatic:  No anemia, purpura, petechiae. Allergic/Immunologic: no itchy/runny eyes, nasal congestion, recent allergic reactions, rashes  PHYSICAL EXAM: Vitals:   01/31/17 1028  BP: 116/70  Pulse: 83   General: No acute distress, wearing sunglasses after dilated eye exam this morning Head:  Normocephalic/atraumatic Neck: supple, no paraspinal tenderness, full range of motion Heart:  Regular rate and rhythm Lungs:  Clear to auscultation bilaterally Back: No paraspinal tenderness Skin/Extremities: No rash, no edema Neurological Exam: alert and oriented to person, place, and time. No aphasia or dysarthria. Fund of knowledge is appropriate.  Recent  and remote memory are intact.  Attention and concentration are normal.    Able to name objects and repeat phrases. Cranial nerves: Pupils equal, round, s/p dilation at ophtho this morning. Fundoscopic exam unremarkable, no papilledema. Extraocular movements intact with no nystagmus. Visual fields full. Facial sensation intact. No facial asymmetry. Tongue, uvula, palate midline.  Motor: Bulk and tone normal, muscle strength 5/5 throughout with no pronator drift.  Sensation to light touch intact.  No extinction to double simultaneous stimulation.  Deep tendon reflexes brisk +2 throughout, toes downgoing.  Finger to nose testing intact.  Gait narrow-based and steady, able to tandem walk adequately.  Romberg negative.  IMPRESSION: This is a 41 yo RH woman with a history of anxiety and migraines since childhood, who presented with a change in  headaches, now with associated nausea, vomiting, and diarrhea. MRI brain normal. Her eye exam at her optometrist office showed slight blurred disks bilaterally, lumbar puncture showed normal opening pressure of 18 cm H2O. She has seen ophthalmology, there is no optic nerve disc edema seen. No evidence of pseudotumor cerebri at this point. She had side effects on Topamax and is now on nortriptyline for migraine prophylaxis. There has been slight improvement, she will uptitrate further to 42m qhs x 2 weeks, then 725mqhs. Monitor for drowsiness. She is also reporting continued paresthesias off the Topamax, bloodwork for TSH, B12, B1, ESR, ANA will be ordered. She has prn Migranal for migraine rescue and Reglan 1039mrn for nausea. She knows to minimize rescue medication to 2-3 times a week to avoid rebound headaches. She will follow-up in 3 months and knows to call for any changes  Thank you for allowing me to participate in her care.  Please do not hesitate to call for any questions or concerns.  The duration of this appointment visit was 25 minutes of face-to-face time with the patient.  Greater than 50% of this time was spent in counseling, explanation of diagnosis, planning of further management, and coordination of care.   KarEllouise Newer.D.   CC: Dr. FulChapman Fitch

## 2017-02-01 ENCOUNTER — Telehealth: Payer: Self-pay

## 2017-02-01 NOTE — Telephone Encounter (Signed)
Notified patient of below. She asks what you suggest could be causing her B12 to be low? She also states her sister is a Charity fundraiserN and wants to know if medication is sent to pharmacy would they still need to come in to be taught how to do injections. Please advise.

## 2017-02-01 NOTE — Telephone Encounter (Signed)
-----   Message from Van ClinesKaren M Aquino, MD sent at 02/01/2017 10:36 AM EST ----- Pls let her know not all the bloodwork has come back, but so far her B12 level has come back low. This can cause numbness and tingling in the hands and feet. Recommend starting B12 replacement. Recommend B12 injections, patient will need 1000 mcg daily for 1 week, then weekly for 1 month, then monthly for a year. Would it be easier/closer for her to do this with her PCP or would they like us to do it? Other option is we teach her to do it, and she does it herself. Thanks

## 2017-02-01 NOTE — Telephone Encounter (Signed)
Notified patient of below. She states she will be by office in next week or so to start b12 injections.

## 2017-02-01 NOTE — Telephone Encounter (Signed)
B12 deficiency can have many different causes. Our body does not make B12, so we have to get it from food/supplements, so a lot of the time low B12 is because of diet. Is she vegan or vegetarian? It can also happen if someone had weight loss surgery, drinks heavily, or takes acid-reducing stomach medications for a long time. Rarely it is due to stomach absorption issues.   Recommend doing first injection here for teaching, then send to pharmacy for refills. Thanks

## 2017-02-03 ENCOUNTER — Ambulatory Visit (INDEPENDENT_AMBULATORY_CARE_PROVIDER_SITE_OTHER): Payer: 59

## 2017-02-03 ENCOUNTER — Other Ambulatory Visit: Payer: Self-pay | Admitting: *Deleted

## 2017-02-03 DIAGNOSIS — E538 Deficiency of other specified B group vitamins: Secondary | ICD-10-CM | POA: Diagnosis not present

## 2017-02-03 LAB — VITAMIN B1: THIAMINE: 93.6 nmol/L (ref 66.5–200.0)

## 2017-02-03 LAB — ANA: Anti Nuclear Antibody(ANA): NEGATIVE

## 2017-02-03 MED ORDER — CYANOCOBALAMIN 1000 MCG/ML IJ SOLN: 1000.0000 ug | Freq: Once | INTRAMUSCULAR | 0 refills | Status: AC

## 2017-02-03 MED ORDER — "SYRINGE 25G X 1"" 3 ML MISC": 1.0000 [IU] | 0 refills | Status: DC

## 2017-02-03 MED ORDER — CYANOCOBALAMIN 1000 MCG/ML IJ SOLN
1000.0000 ug | Freq: Once | INTRAMUSCULAR | Status: AC
  Administered 2017-02-03: 1000 ug via INTRAMUSCULAR

## 2017-02-04 ENCOUNTER — Ambulatory Visit (INDEPENDENT_AMBULATORY_CARE_PROVIDER_SITE_OTHER): Payer: 59 | Admitting: *Deleted

## 2017-02-04 DIAGNOSIS — E538 Deficiency of other specified B group vitamins: Secondary | ICD-10-CM

## 2017-02-04 MED ORDER — CYANOCOBALAMIN 1000 MCG/ML IJ SOLN
1000.0000 ug | Freq: Once | INTRAMUSCULAR | Status: AC
  Administered 2017-02-04: 1000 ug via INTRAMUSCULAR

## 2017-02-07 ENCOUNTER — Ambulatory Visit (INDEPENDENT_AMBULATORY_CARE_PROVIDER_SITE_OTHER): Payer: 59

## 2017-02-07 DIAGNOSIS — E538 Deficiency of other specified B group vitamins: Secondary | ICD-10-CM | POA: Diagnosis not present

## 2017-02-07 MED ORDER — CYANOCOBALAMIN 1000 MCG/ML IJ SOLN
1000.0000 ug | Freq: Once | INTRAMUSCULAR | Status: AC
  Administered 2017-02-07: 1000 ug via INTRAMUSCULAR

## 2017-02-08 ENCOUNTER — Ambulatory Visit (INDEPENDENT_AMBULATORY_CARE_PROVIDER_SITE_OTHER): Payer: 59 | Admitting: *Deleted

## 2017-02-08 DIAGNOSIS — E538 Deficiency of other specified B group vitamins: Secondary | ICD-10-CM | POA: Diagnosis not present

## 2017-02-08 MED ORDER — CYANOCOBALAMIN 1000 MCG/ML IJ SOLN
1000.0000 ug | Freq: Once | INTRAMUSCULAR | Status: AC
  Administered 2017-02-08: 1000 ug via INTRAMUSCULAR

## 2017-02-08 NOTE — Progress Notes (Signed)
Patient here for B12 injection. Gave cyanocobalamin in L Deltoid. Patient tolerated well.

## 2017-02-09 ENCOUNTER — Ambulatory Visit (INDEPENDENT_AMBULATORY_CARE_PROVIDER_SITE_OTHER): Payer: 59

## 2017-02-09 DIAGNOSIS — E538 Deficiency of other specified B group vitamins: Secondary | ICD-10-CM

## 2017-02-09 MED ORDER — CYANOCOBALAMIN 1000 MCG/ML IJ SOLN
1000.0000 ug | Freq: Once | INTRAMUSCULAR | Status: AC
Start: 2017-02-09 — End: 2017-02-09
  Administered 2017-02-09: 1000 ug via INTRAMUSCULAR

## 2017-02-10 ENCOUNTER — Telehealth: Payer: Self-pay

## 2017-02-10 NOTE — Telephone Encounter (Signed)
Notified patient of results when she came in on 2/21 for b12 injection.

## 2017-02-10 NOTE — Telephone Encounter (Signed)
-----   Message from Van ClinesKaren M Aquino, MD sent at 02/08/2017  2:55 PM EST ----- Pls let her know the rest of her bloodwork looked good, no evidence of inflammation, thanks

## 2017-02-11 ENCOUNTER — Ambulatory Visit (INDEPENDENT_AMBULATORY_CARE_PROVIDER_SITE_OTHER): Payer: 59

## 2017-02-11 DIAGNOSIS — R0602 Shortness of breath: Secondary | ICD-10-CM

## 2017-02-11 DIAGNOSIS — R0789 Other chest pain: Secondary | ICD-10-CM

## 2017-02-11 LAB — ECHOCARDIOGRAM STRESS TEST
CHL CUP RESTING HR STRESS: 85 {beats}/min
CSEPED: 8 min
CSEPEDS: 55 s
CSEPEW: 10.1 METS
MPHR: 181 {beats}/min
Peak HR: 176 {beats}/min
Percent HR: 97 %

## 2017-03-11 ENCOUNTER — Telehealth: Payer: Self-pay

## 2017-03-11 ENCOUNTER — Ambulatory Visit (INDEPENDENT_AMBULATORY_CARE_PROVIDER_SITE_OTHER): Payer: 59 | Admitting: Neurology

## 2017-03-11 DIAGNOSIS — E538 Deficiency of other specified B group vitamins: Secondary | ICD-10-CM

## 2017-03-11 MED ORDER — CYANOCOBALAMIN 1000 MCG/ML IJ SOLN
1000.0000 ug | Freq: Once | INTRAMUSCULAR | Status: AC
  Administered 2017-03-11: 1000 ug via INTRAMUSCULAR

## 2017-03-11 NOTE — Telephone Encounter (Signed)
Received Mental Status Supplemental Questionnaire from [facility].  Forwarded to provider for completion.

## 2017-03-11 NOTE — Telephone Encounter (Signed)
Per provider  Clld [facility] - advsd our office only seen the pt for migraines. Form would need to be sent to pt's PCP or Psychiatrist.

## 2017-03-14 ENCOUNTER — Telehealth: Payer: Self-pay

## 2017-03-14 NOTE — Telephone Encounter (Signed)
Clld [facility] back - LMOVM advnsg the Mental Status supplemental questionnaire form will need to be completed by the pt's PCP or psychiatrist. Our office only treated her for migraines.

## 2017-03-14 NOTE — Telephone Encounter (Signed)
-----   Message from [facility] Cantey sent at 03/11/2017 12:02 PM EDT ----- Confirm that a form was received on PT for supramental questionnaire from [facility]/Dawn CB#[phone] Case #[id number]

## 2017-04-01 ENCOUNTER — Encounter: Payer: Self-pay | Admitting: Family Medicine

## 2017-04-21 LAB — HM PAP SMEAR

## 2017-04-22 ENCOUNTER — Ambulatory Visit (INDEPENDENT_AMBULATORY_CARE_PROVIDER_SITE_OTHER): Payer: 59 | Admitting: *Deleted

## 2017-04-22 DIAGNOSIS — E538 Deficiency of other specified B group vitamins: Secondary | ICD-10-CM

## 2017-04-22 MED ORDER — CYANOCOBALAMIN 1000 MCG/ML IJ SOLN
1000.0000 ug | Freq: Once | INTRAMUSCULAR | Status: AC
  Administered 2017-04-22: 1000 ug via INTRAMUSCULAR

## 2017-05-05 ENCOUNTER — Ambulatory Visit (INDEPENDENT_AMBULATORY_CARE_PROVIDER_SITE_OTHER): Payer: 59 | Admitting: Neurology

## 2017-05-05 ENCOUNTER — Encounter: Payer: Self-pay | Admitting: Neurology

## 2017-05-05 VITALS — BP 102/70 | HR 71 | Ht 63.0 in | Wt 165.0 lb

## 2017-05-05 DIAGNOSIS — R202 Paresthesia of skin: Secondary | ICD-10-CM

## 2017-05-05 DIAGNOSIS — E538 Deficiency of other specified B group vitamins: Secondary | ICD-10-CM | POA: Diagnosis not present

## 2017-05-05 DIAGNOSIS — G43109 Migraine with aura, not intractable, without status migrainosus: Secondary | ICD-10-CM | POA: Diagnosis not present

## 2017-05-05 MED ORDER — CYANOCOBALAMIN 1000 MCG/ML IJ SOLN
1000.0000 ug | Freq: Once | INTRAMUSCULAR | Status: AC
  Administered 2017-05-05: 1000 ug via INTRAMUSCULAR

## 2017-05-05 MED ORDER — NORTRIPTYLINE HCL 50 MG PO CAPS: ORAL_CAPSULE | ORAL | 11 refills | Status: DC

## 2017-05-05 NOTE — Progress Notes (Signed)
NEUROLOGY FOLLOW UP OFFICE NOTE  Sylvia [name] [id number]  HISTORY OF PRESENT ILLNESS: I had the pleasure of seeing Sylvia [name] in follow-up in the neurology clinic on 05/05/2017.  The patient was last seen 3 months ago for worsening migraines. On her last visit, she continued to report 4-5 migraines a week, nortriptyline dose was increased to 75mg  qhs. She continues to have the same frequency of migraines, but continues to note that they do not last as long, getting subdued after a couple of hours instead of lasting for days, depending on loud sounds, flashing lights. She has started avoiding more triggers. The Migranal nasal spray helps, but in the past 2 weeks, she would get nosebleeds lasting 5 minutes. She reports pressure on the right side of her head today. She has tension in the upper neck regions. She was reporting tingling in her hands even after stopping Topamax. Bloodwork unremarkable except for low B12 level, she is now on B12 injections and has noticed the tingling in her fingers is not as often. She does not have as much twitching. She has felt a difference in her body with more energy. She is tolerating nortriptyline 75mg  qhs with no significant daytime drowsiness.   HPI 11/25/2016: This is a pleasant 41 yo RH woman with a history of anxiety who presented for second opinion. She has had a history of migraines since she was 41 years old where she would feel sick to her stomach, however over the past 3 years, headaches have changed, she now had headaches with associated nausea, vomiting, and diarrhea. The headaches are different, she knows now they are coming on, she cannot look at the phone or computer without triggering a headache. Headaches start in the occipital region then radiates forward, starting with a pressure/squeezing sensation, then stabbing pain behind her eyes. She would see floating dots and become sensitive to lights and sounds. She would have profuse  diarrhea when the headaches start. She had been taking Ambien and Xanax but weaned off then thinking these were causing the diarrhea. Headaches occur 4-5 times a week lasting for several hours, she would go to a dark room and take Ibuprofen 800mg  which bring the headaches from a 10/10 to 7/10, but still have throbbing and light sensitivity. She cannot drive at night or in direct sunlight, fluorescent lights bother her. She feels her right eye would tear, and hears a long doorbell for 30-45 minutes. When the pressure builds up, she feels her heart beat in her ears that lasts the duration of the headache. She has double vision and feels equilibrium is off with the headaches. Her neck feels tight. She had seen neurologist Dr. Anne Hahn last September and had brain imaging which I personally reviewed. MRI brain without contrast was normal. She saw her optometrist Dr. Conley Rolls who noted slight blurred disk OU with cup-to-disc ratio of 0.4/0.4 OD and 0.4/0.4 OS. She had a lumbar puncture with normal opening pressure of 18 cm H2O in left lateral decubitus position. She was given a Prednisone dosepak and started on Topamax, currently on 75mg  qhs, but states that after taking it for 2-1/2 weeks, she started feeling suicidal. She is still having suicidal thoughts, no plan, and is now seeing a therapist. She denies any prior history of depression or suicidal ideation. She has 3 children and has been on leave from work due to the headaches since 08/11/16. She also notices tingling in her fingers and bottom of feet since starting Topamax. She denies any falls,  no back pain, bowel/bladder dysfunction. She usually gets 4-5 hours of sleep. Her paternal cousin has migraines. She has not tried any other migraine medications  Records and images were personally reviewed where available.  She was noted to have blurred disks OU at her optometrist office. She had an LP with normal opening pressure. She has been evaluated by ophthalmologist Dr.  Alben SpittleWeaver, there was no optic disc edema seen, unlikely to have IIH.   PAST MEDICAL HISTORY: Past Medical History:  Diagnosis Date  . Anxiety   . Common migraine with intractable migraine 08/25/2016  . Migraine     MEDICATIONS: Current Outpatient Prescriptions on File Prior to Visit  Medication Sig Dispense Refill  . ALPRAZolam (XANAX) 0.25 MG tablet Take 0.25 mg by mouth at bedtime as needed for anxiety.    . citalopram (CELEXA) 20 MG tablet Take 10 mg by mouth daily.     Marland Kitchen. dihydroergotamine (MIGRANAL) 4 MG/ML nasal spray Place 1 spray into the nose as needed for migraine. Use in one nostril as directed.  No more than 4 sprays in one hour 8 mL 12  . diphenhydrAMINE (BENADRYL) 25 MG tablet Take 25 mg by mouth every 6 (six) hours as needed.    Marland Kitchen. ibuprofen (ADVIL,MOTRIN) 200 MG tablet Take 200 mg by mouth every 6 (six) hours as needed.    . metoCLOPramide (REGLAN) 10 MG tablet Take 1 tablet (10 mg total) by mouth every 8 (eight) hours as needed for nausea or vomiting. 30 tablet 5  . nortriptyline (PAMELOR) 25 MG capsule Take 3 capsules at night 90 capsule 6  . Syringe/Needle, Disp, (SYRINGE 3CC/25GX1") 25G X 1" 3 ML MISC 1 Units by Does not apply route See admin instructions. 25 each 0  . zolpidem (AMBIEN) 5 MG tablet Take 5 mg by mouth at bedtime as needed for sleep.     No current facility-administered medications on file prior to visit.     ALLERGIES: Allergies  Allergen Reactions  . Latex Hives  . Penicillins Hives    FAMILY HISTORY: Family History  Problem Relation Age of Onset  . Heart attack Father   . Heart disease Father     SOCIAL HISTORY: Social History   Social History  . Marital status: Married    Spouse name: N/A  . Number of children: 4  . Years of education: College   Occupational History  . [employer]     Social History Main Topics  . Smoking status: Former Smoker    Start date: 08/25/1994  . Smokeless tobacco: Never Used  . Alcohol use No     Comment: Quit  12/2015  . Drug use: No  . Sexual activity: Not on file     Comment: Married   Other Topics Concern  . Not on file   Social History Narrative   Lives at home w/ husband and 3 children, also has 1 step-child   Right-handed   Caffeine: none since 05/2016    REVIEW OF SYSTEMS: Constitutional: No fevers, chills, or sweats, no generalized fatigue, change in appetite Eyes: No visual changes, double vision, eye pain Ear, nose and throat: No hearing loss, ear pain, nasal congestion, sore throat Cardiovascular: No chest pain, palpitations Respiratory:  No shortness of breath at rest or with exertion, wheezes GastrointestinaI: No nausea, vomiting, diarrhea, abdominal pain, fecal incontinence Genitourinary:  No dysuria, urinary retention or frequency Musculoskeletal:  + neck pain,no back pain Integumentary: No rash, pruritus, skin lesions Neurological: as above Psychiatric: No depression,  insomnia, anxiety Endocrine: No palpitations, fatigue, diaphoresis, mood swings, change in appetite, change in weight, increased thirst Hematologic/Lymphatic:  No anemia, purpura, petechiae. Allergic/Immunologic: no itchy/runny eyes, nasal congestion, recent allergic reactions, rashes  PHYSICAL EXAM: Vitals:   05/05/17 1139  BP: 102/70  Pulse: 71   General: No acute distress Head:  Normocephalic/atraumatic Neck: supple, no paraspinal tenderness, full range of motion Heart:  Regular rate and rhythm Lungs:  Clear to auscultation bilaterally Back: No paraspinal tenderness Skin/Extremities: No rash, no edema Neurological Exam: alert and oriented to person, place, and time. No aphasia or dysarthria. Fund of knowledge is appropriate.  Recent and remote memory are intact.  Attention and concentration are normal.    Able to name objects and repeat phrases. Cranial nerves: Pupils equal, round, s/p dilation at ophtho this morning. Fundoscopic exam unremarkable, no papilledema. Extraocular movements intact with  no nystagmus. Visual fields full. Facial sensation intact. No facial asymmetry. Tongue, uvula, palate midline.  Motor: Bulk and tone normal, muscle strength 5/5 throughout with no pronator drift.  Sensation to light touch intact.  No extinction to double simultaneous stimulation.  Deep tendon reflexes brisk +2 throughout, toes downgoing.  Finger to nose testing intact.  Gait narrow-based and steady, able to tandem walk adequately.  Romberg negative.  IMPRESSION: This is a 41 yo RH woman with a history of anxiety and migraines since childhood, who presented with a change in headaches, now with associated nausea, vomiting, and diarrhea. MRI brain normal. Her eye exam at her optometrist office showed slight blurred disks bilaterally, lumbar puncture showed normal opening pressure of 18 cm H2O. She has seen ophthalmology, there is no optic nerve disc edema seen. No evidence of pseudotumor cerebri at this point. She had side effects on Topamax and is now on nortriptyline for migraine prophylaxis. There has been slight improvement but continues to report 4-5 migraines a week. Increase nortriptyline to 100mg  qhs. We discussed potentially adding on another headache preventative medication such as Atenolol, Zonisamide, or Gabapentin. She would like to try the increase in nortriptyline first. She has prn Migranal for rescue. She will try using a saline nasal spray 20-30 minutes prior to Migranal use and see if this helps with nosebleeds. Continue B12 replacement. Continue migraine calendar, she will follow-up in 4 months and knows to call for any changes  Thank you for allowing me to participate in her care.  Please do not hesitate to call for any questions or concerns.  The duration of this appointment visit was 25 minutes of face-to-face time with the patient.  Greater than 50% of this time was spent in counseling, explanation of diagnosis, planning of further management, and coordination of care.   Patrcia Dolly,  M.D.   CC: Dr. Jeanice Lim

## 2017-05-05 NOTE — Patient Instructions (Signed)
1. Increase nortriptyline to 100mg  at night 2. Try using saline nasal spray around 20-30 minutes before using Migranal and see if this helps with nosebleeding 3. Continue headache calendar and avoidance of triggers 4. Continue B12 injections 5. Follow-up in 4 months, call for any changes

## 2017-06-29 ENCOUNTER — Other Ambulatory Visit: Payer: Self-pay

## 2017-06-29 ENCOUNTER — Telehealth: Payer: Self-pay | Admitting: Neurology

## 2017-06-29 DIAGNOSIS — G43109 Migraine with aura, not intractable, without status migrainosus: Secondary | ICD-10-CM

## 2017-06-29 MED ORDER — NORTRIPTYLINE HCL 50 MG PO CAPS: ORAL_CAPSULE | ORAL | 11 refills | Status: DC

## 2017-06-29 MED ORDER — METOCLOPRAMIDE HCL 10 MG PO TABS: 10.0000 mg | ORAL_TABLET | Freq: Three times a day (TID) | ORAL | 5 refills | Status: DC | PRN

## 2017-06-29 MED ORDER — DIHYDROERGOTAMINE MESYLATE 4 MG/ML NA SOLN: 1.0000 | NASAL | 12 refills | Status: DC | PRN

## 2017-06-29 NOTE — Telephone Encounter (Signed)
Patients need the migranal nasal spray and her other migraine medication (she did not know the name ) called in to the [facility] on [address]

## 2017-06-29 NOTE — Telephone Encounter (Signed)
Spoke with pt who states that she has switched pharmacies due to [facility] "Losing" her refills.  [facility], her nortriptyline was just prescribed May 05, 2017 with 11 refills but it does say that she has no more refills remaining.  Let pt know that I would send all 3 Rx's to her new pharmacy [facility])

## 2017-06-29 NOTE — Telephone Encounter (Signed)
Rx's sent to pharmacy.  

## 2017-09-08 ENCOUNTER — Ambulatory Visit: Payer: 59 | Admitting: Neurology

## 2017-09-26 ENCOUNTER — Ambulatory Visit: Payer: 59 | Admitting: Neurology

## 2017-10-06 ENCOUNTER — Other Ambulatory Visit: Payer: Self-pay | Admitting: Neurology

## 2017-10-06 MED ORDER — "SYRINGE 25G X 1"" 3 ML MISC": 1.0000 [IU] | 0 refills | Status: DC

## 2017-10-19 ENCOUNTER — Encounter: Payer: Self-pay | Admitting: Neurology

## 2017-10-19 ENCOUNTER — Ambulatory Visit (INDEPENDENT_AMBULATORY_CARE_PROVIDER_SITE_OTHER): Payer: Medicaid Other | Admitting: Neurology

## 2017-10-19 VITALS — BP 96/72 | HR 91 | Ht 63.0 in | Wt 180.0 lb

## 2017-10-19 DIAGNOSIS — E538 Deficiency of other specified B group vitamins: Secondary | ICD-10-CM

## 2017-10-19 DIAGNOSIS — IMO0002 Reserved for concepts with insufficient information to code with codable children: Secondary | ICD-10-CM

## 2017-10-19 DIAGNOSIS — G43109 Migraine with aura, not intractable, without status migrainosus: Secondary | ICD-10-CM

## 2017-10-19 DIAGNOSIS — G43709 Chronic migraine without aura, not intractable, without status migrainosus: Secondary | ICD-10-CM | POA: Diagnosis not present

## 2017-10-19 MED ORDER — DIHYDROERGOTAMINE MESYLATE 4 MG/ML NA SOLN: NASAL | 12 refills | Status: DC

## 2017-10-19 MED ORDER — NORTRIPTYLINE HCL 50 MG PO CAPS: ORAL_CAPSULE | ORAL | 11 refills | Status: DC

## 2017-10-19 MED ORDER — GABAPENTIN 100 MG PO CAPS: ORAL_CAPSULE | ORAL | 6 refills | Status: DC

## 2017-10-19 MED ORDER — CYANOCOBALAMIN 1000 MCG/ML IJ SOLN
1000.0000 ug | Freq: Once | INTRAMUSCULAR | Status: AC
  Administered 2017-10-19: 1000 ug via INTRAMUSCULAR

## 2017-10-19 NOTE — Progress Notes (Signed)
NEUROLOGY FOLLOW UP OFFICE NOTE  Sylvia [name] [id number]  HISTORY OF PRESENT ILLNESS: I had the pleasure of seeing Sylvia [name] in follow-up in the neurology clinic on 10/19/2017.  The patient was last seen 5 months ago for worsening migraines. On her last visit, she continued to report 4-5 migraines a week, nortriptyline dose was increased to 100mg  qhs. She continues to have the same frequency of migraines, but has seen some improvement, migraines now last 4-5 hours instead of all day, and the diarrhea and nausea are shorter as well. She reports these definitely impair what she does and where she goes. She uses Migranal 4-5 times a week. She reported nose bleeding with Migranal use on her last visit, and was advised to use a nasal spray 30 minutes prior to Migranal. She reports that over the past 4-5 weeks, there has been a change in her epistaxis, they are "clotty" around 2-3 times a week. Epistaxis comes with or after a migraine, she also notices the veins around her eyes are more prominent. She notes sleep is better. She continues on B12 injections.  HPI 11/25/2016: This is a pleasant 41 yo RH woman with a history of anxiety who presented for second opinion. She has had a history of migraines since she was 41 years old where she would feel sick to her stomach, however over the past 3 years, headaches have changed, she now had headaches with associated nausea, vomiting, and diarrhea. The headaches are different, she knows now they are coming on, she cannot look at the phone or computer without triggering a headache. Headaches start in the occipital region then radiates forward, starting with a pressure/squeezing sensation, then stabbing pain behind her eyes. She would see floating dots and become sensitive to lights and sounds. She would have profuse diarrhea when the headaches start. She had been taking Ambien and Xanax but weaned off then thinking these were causing the  diarrhea. Headaches occur 4-5 times a week lasting for several hours, she would go to a dark room and take Ibuprofen 800mg  which bring the headaches from a 10/10 to 7/10, but still have throbbing and light sensitivity. She cannot drive at night or in direct sunlight, fluorescent lights bother her. She feels her right eye would tear, and hears a long doorbell for 30-45 minutes. When the pressure builds up, she feels her heart beat in her ears that lasts the duration of the headache. She has double vision and feels equilibrium is off with the headaches. Her neck feels tight. She had seen neurologist Dr. Anne Hahn last September and had brain imaging which I personally reviewed. MRI brain without contrast was normal. She saw her optometrist Dr. Conley Rolls who noted slight blurred disk OU with cup-to-disc ratio of 0.4/0.4 OD and 0.4/0.4 OS. She had a lumbar puncture with normal opening pressure of 18 cm H2O in left lateral decubitus position. She was given a Prednisone dosepak and started on Topamax, currently on 75mg  qhs, but states that after taking it for 2-1/2 weeks, she started feeling suicidal. She is still having suicidal thoughts, no plan, and is now seeing a therapist. She denies any prior history of depression or suicidal ideation. She has 3 children and has been on leave from work due to the headaches since 08/11/16. She also notices tingling in her fingers and bottom of feet since starting Topamax. She denies any falls, no back pain, bowel/bladder dysfunction. She usually gets 4-5 hours of sleep. Her paternal cousin has migraines. She has not  tried any other migraine medications  Records and images were personally reviewed where available.  She was noted to have blurred disks OU at her optometrist office. She had an LP with normal opening pressure. She has been evaluated by ophthalmologist Dr. Alben Spittle, there was no optic disc edema seen, unlikely to have IIH.   PAST MEDICAL HISTORY: Past Medical History:  Diagnosis  Date  . Anxiety   . Common migraine with intractable migraine 08/25/2016  . Migraine     MEDICATIONS: Current Outpatient Prescriptions on File Prior to Visit  Medication Sig Dispense Refill  . ALPRAZolam (XANAX) 0.25 MG tablet Take 0.25 mg by mouth at bedtime as needed for anxiety.    . citalopram (CELEXA) 20 MG tablet Take 10 mg by mouth daily.     Marland Kitchen dihydroergotamine (MIGRANAL) 4 MG/ML nasal spray Place 1 spray into the nose as needed for migraine. Use in one nostril as directed.  No more than 4 sprays in one hour 8 mL 12  . diphenhydrAMINE (BENADRYL) 25 MG tablet Take 25 mg by mouth every 6 (six) hours as needed.    Marland Kitchen ibuprofen (ADVIL,MOTRIN) 200 MG tablet Take 200 mg by mouth every 6 (six) hours as needed.    . metoCLOPramide (REGLAN) 10 MG tablet Take 1 tablet (10 mg total) by mouth every 8 (eight) hours as needed for nausea or vomiting. 30 tablet 5  . nortriptyline (PAMELOR) 50 MG capsule Take 1 capsules at night 60 capsule 11  . Syringe/Needle, Disp, (SYRINGE 3CC/25GX1") 25G X 1" 3 ML MISC 1 Units by Does not apply route See admin instructions. 25 each 0  . zolpidem (AMBIEN) 5 MG tablet Take 5 mg by mouth at bedtime as needed for sleep.     No current facility-administered medications on file prior to visit.     ALLERGIES: Allergies  Allergen Reactions  . Latex Hives  . Penicillins Hives    FAMILY HISTORY: Family History  Problem Relation Age of Onset  . Heart attack Father   . Heart disease Father     SOCIAL HISTORY: Social History   Social History  . Marital status: Married    Spouse name: N/A  . Number of children: 4  . Years of education: College   Occupational History  . [employer]     Social History Main Topics  . Smoking status: Former Smoker    Start date: 08/25/1994  . Smokeless tobacco: Never Used  . Alcohol use No     Comment: Quit 12/2015  . Drug use: No  . Sexual activity: Not on file     Comment: Married   Other Topics Concern  . Not on file    Social History Narrative   Lives at home w/ husband and 3 children, also has 1 step-child   Right-handed   Caffeine: none since 05/2016    REVIEW OF SYSTEMS: Constitutional: No fevers, chills, or sweats, no generalized fatigue, change in appetite Eyes: No visual changes, double vision, eye pain Ear, nose and throat: No hearing loss, ear pain, nasal congestion, sore throat Cardiovascular: No chest pain, palpitations Respiratory:  No shortness of breath at rest or with exertion, wheezes GastrointestinaI: No nausea, vomiting, diarrhea, abdominal pain, fecal incontinence Genitourinary:  No dysuria, urinary retention or frequency Musculoskeletal:  + neck pain,no back pain Integumentary: No rash, pruritus, skin lesions Neurological: as above Psychiatric: No depression, insomnia, anxiety Endocrine: No palpitations, fatigue, diaphoresis, mood swings, change in appetite, change in weight, increased thirst Hematologic/Lymphatic:  No  anemia, purpura, petechiae. Allergic/Immunologic: no itchy/runny eyes, nasal congestion, recent allergic reactions, rashes  PHYSICAL EXAM: Vitals:   10/19/17 1154  BP: 96/72  Pulse: 91  SpO2: 99%   General: No acute distress Head:  Normocephalic/atraumatic Neck: supple, no paraspinal tenderness, full range of motion Heart:  Regular rate and rhythm Lungs:  Clear to auscultation bilaterally Back: No paraspinal tenderness Skin/Extremities: No rash, no edema Neurological Exam: alert and oriented to person, place, and time. No aphasia or dysarthria. Fund of knowledge is appropriate.  Recent and remote memory are intact.  Attention and concentration are normal.    Able to name objects and repeat phrases. Cranial nerves: Pupils equal, round, reactive to light. Fundoscopic exam unremarkable, no papilledema. Extraocular movements intact with no nystagmus. Visual fields full. Facial sensation intact. No facial asymmetry. Tongue, uvula, palate midline.  Motor: Bulk and  tone normal, muscle strength 5/5 throughout with no pronator drift.  Sensation to light touch intact.  No extinction to double simultaneous stimulation.  Deep tendon reflexes brisk +2 throughout, toes downgoing.  Finger to nose testing intact.  Gait narrow-based and steady, able to tandem walk adequately.  Romberg negative.  IMPRESSION: This is a 41 yo RH woman with a history of anxiety and migraines since childhood, who presented with a change in headaches, now with associated nausea, vomiting, and diarrhea. MRI brain normal. Her eye exam at her optometrist office showed slight blurred disks bilaterally, lumbar puncture showed normal opening pressure of 18 cm H2O. She has seen ophthalmology, there is no optic nerve disc edema seen. No evidence of pseudotumor cerebri at this point. She had side effects on Topamax and is on nortriptyline 100mg  qhs still with 4-5 migraines a week. She was advised to continue nortriptyline and add on low dose gabapentin 100mg  qhs x 2 weeks, then increase to 2 caps at night. Side effects were discussed, we may uptitrate as tolerated. She is reporting an increase in epistaxis, this may be due to overuse of Migranal nasal spray, she was advised to use only 2-3 times a week. She will receive B12 injection today for B12 deficiency. Continue migraine calendar, she will follow-up in 3-4 months and knows to call for any changes  Thank you for allowing me to participate in her care.  Please do not hesitate to call for any questions or concerns.  The duration of this appointment visit was 25 minutes of face-to-face time with the patient.  Greater than 50% of this time was spent in counseling, explanation of diagnosis, planning of further management, and coordination of care.   Patrcia DollyKaren Geraldo Haris, M.D.   CC: Dr. Jeanice Limurham

## 2017-10-19 NOTE — Patient Instructions (Addendum)
1. Start Gabapentin 100mg : take 1 capsule at night for 2 weeks, then increase to 2 capsules at night 2. Continue nortriptyline 50mg : Take 2 caps at night 3. Only use the Migranal nasal spray 2-3 times a week. Premedicate and lubricate first with nasal spray 30 minutes before using Migrainal 4. Continue migraine calendar, also write down when using Migranal nasal spray 5. Follow-up in 3-4 months, call for any changes

## 2017-10-24 ENCOUNTER — Encounter: Payer: Self-pay | Admitting: Neurology

## 2017-10-25 ENCOUNTER — Other Ambulatory Visit: Payer: Self-pay | Admitting: Neurology

## 2017-11-07 ENCOUNTER — Telehealth: Payer: Self-pay | Admitting: Neurology

## 2017-11-07 ENCOUNTER — Encounter: Payer: Self-pay | Admitting: Neurology

## 2017-11-07 NOTE — Telephone Encounter (Signed)
Pt called and asked if she could have a note for school, she is on the computer for extended periods of time using what they call ebooks which triggers her migraines so she wants a note written to the school so she can get the actual book and not the ebooks

## 2017-11-08 ENCOUNTER — Encounter: Payer: Self-pay | Admitting: Neurology

## 2017-11-08 NOTE — Telephone Encounter (Signed)
Agree with note written, thanks!

## 2017-11-08 NOTE — Telephone Encounter (Signed)
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

## 2017-12-16 ENCOUNTER — Encounter: Payer: Self-pay | Admitting: Neurology

## 2017-12-16 ENCOUNTER — Ambulatory Visit (INDEPENDENT_AMBULATORY_CARE_PROVIDER_SITE_OTHER): Payer: Medicaid Other | Admitting: Neurology

## 2017-12-16 VITALS — BP 100/60 | HR 73 | Ht 63.0 in | Wt 176.1 lb

## 2017-12-16 DIAGNOSIS — G43109 Migraine with aura, not intractable, without status migrainosus: Secondary | ICD-10-CM | POA: Diagnosis not present

## 2017-12-16 MED ORDER — GABAPENTIN 100 MG PO CAPS: ORAL_CAPSULE | ORAL | 6 refills | Status: DC

## 2017-12-16 NOTE — Patient Instructions (Signed)
1. Increase gabapentin 100mg : take 3 caps at night. If this makes you too groggy, reduce the nortriptyline down to 1 capsule at night 2. Try the TheraSpecs to help with fluorescent lights 3. Continue migraine calendar 4. Minimize Migranal to 2-3 times a week 5. Follow-up in 4 months, call for any changes

## 2017-12-16 NOTE — Progress Notes (Signed)
NEUROLOGY FOLLOW UP OFFICE NOTE  Sylvia [name] [id number]  DOB: February 07, 1976  HISTORY OF PRESENT ILLNESS: I had the pleasure of seeing Sylvia [name] in follow-up in the neurology clinic on 12/16/2017.  The patient was last seen 2 months ago for worsening migraines. On her last visit, she continued to report 4-5 migraines a week on nortriptyline dose was increased to 100mg  qhs. She did note that they were lasting shorter and the diarrhea and nausea were shorter duration as well. Gabapentin was added on. She was also reporting epistaxis usually with or after a migraine, possibly due to Migranal nasal spray overuse. She reports that she does notice an improvement with the gabapentin. She reports 4-5 migraines a week, but of shorter duration. She has found that fluorescent lights trigger her symptoms. The gabapentin at night helps with sleep. She has reduced Migranal use to 2-3 times a week, and found the nosebleeds are not as heavy/"clotty." She denies any vision changes, dizziness, focal numbness/tingling/weakness. She fell and slipped on snow last month, no significant injuries. She continues on B12 injections.  HPI 11/25/2016: This is a pleasant 41 yo RH woman with a history of anxiety who presented for second opinion. She has had a history of migraines since she was [age] years old where she would feel sick to her stomach, however over the past 3 years, headaches have changed, she now had headaches with associated nausea, vomiting, and diarrhea. The headaches are different, she knows now they are coming on, she cannot look at the phone or computer without triggering a headache. Headaches start in the occipital region then radiates forward, starting with a pressure/squeezing sensation, then stabbing pain behind her eyes. She would see floating dots and become sensitive to lights and sounds. She would have profuse diarrhea when the headaches start. She had been taking Ambien and Xanax but  weaned off then thinking these were causing the diarrhea. Headaches occur 4-5 times a week lasting for several hours, she would go to a dark room and take Ibuprofen 800mg  which bring the headaches from a 10/10 to 7/10, but still have throbbing and light sensitivity. She cannot drive at night or in direct sunlight, fluorescent lights bother her. She feels her right eye would tear, and hears a long doorbell for 30-45 minutes. When the pressure builds up, she feels her heart beat in her ears that lasts the duration of the headache. She has double vision and feels equilibrium is off with the headaches. Her neck feels tight. She had seen neurologist Dr. Anne HahnWillis last September and had brain imaging which I personally reviewed. MRI brain without contrast was normal. She saw her optometrist Dr. Conley RollsLe who noted slight blurred disk OU with cup-to-disc ratio of 0.4/0.4 OD and 0.4/0.4 OS. She had a lumbar puncture with normal opening pressure of 18 cm H2O in left lateral decubitus position. She was given a Prednisone dosepak and started on Topamax, currently on 75mg  qhs, but states that after taking it for 2-1/2 weeks, she started feeling suicidal. She is still having suicidal thoughts, no plan, and is now seeing a therapist. She denies any prior history of depression or suicidal ideation. She has 3 children and has been on leave from work due to the headaches since 08/11/16. She also notices tingling in her fingers and bottom of feet since starting Topamax. She denies any falls, no back pain, bowel/bladder dysfunction. She usually gets 4-5 hours of sleep. Her paternal cousin has migraines. She has not tried any other migraine  medications  Records and images were personally reviewed where available.  She was noted to have blurred disks OU at her optometrist office. She had an LP with normal opening pressure. She has been evaluated by ophthalmologist Dr. Alben SpittleWeaver, there was no optic disc edema seen, unlikely to have IIH.   PAST  MEDICAL HISTORY: Past Medical History:  Diagnosis Date  . Anxiety   . Common migraine with intractable migraine 08/25/2016  . Migraine     MEDICATIONS: Current Outpatient Medications on File Prior to Visit  Medication Sig Dispense Refill  . ALPRAZolam (XANAX) 0.25 MG tablet Take 0.25 mg by mouth at bedtime as needed for anxiety.    . citalopram (CELEXA) 20 MG tablet Take 10 mg by mouth daily.     . cyanocobalamin (,VITAMIN B-12,) 1000 MCG/ML injection INJECT 1 ML ONCE A MONTH 1 mL 0  . dihydroergotamine (MIGRANAL) 4 MG/ML nasal spray Use in one nostril as directed.  Do not use more than 2-3 times a week 8 mL 12  . diphenhydrAMINE (BENADRYL) 25 MG tablet Take 25 mg by mouth every 6 (six) hours as needed.    . gabapentin (NEURONTIN) 100 MG capsule Take 1 cap at night for 2 weeks, then increase to 2 caps at night 60 capsule 6  . ibuprofen (ADVIL,MOTRIN) 200 MG tablet Take 200 mg by mouth every 6 (six) hours as needed.    . metoCLOPramide (REGLAN) 10 MG tablet Take 1 tablet (10 mg total) by mouth every 8 (eight) hours as needed for nausea or vomiting. 30 tablet 5  . nortriptyline (PAMELOR) 50 MG capsule Take 2 caps at night 60 capsule 11  . Syringe/Needle, Disp, (SYRINGE 3CC/25GX1") 25G X 1" 3 ML MISC 1 Units by Does not apply route See admin instructions. 25 each 0  . zolpidem (AMBIEN) 5 MG tablet Take 5 mg by mouth at bedtime as needed for sleep.     No current facility-administered medications on file prior to visit.     ALLERGIES: Allergies  Allergen Reactions  . Latex Hives  . Penicillins Hives    FAMILY HISTORY: Family History  Problem Relation Age of Onset  . Heart attack Father   . Heart disease Father     SOCIAL HISTORY: Social History   Socioeconomic History  . Marital status: Married    Spouse name: Not on file  . Number of children: 4  . Years of education: College  . Highest education level: Not on file  Social Needs  . Financial resource strain: Not on file    . Food insecurity - worry: Not on file  . Food insecurity - inability: Not on file  . Transportation needs - medical: Not on file  . Transportation needs - non-medical: Not on file  Occupational History  . Occupation: [employer]   Tobacco Use  . Smoking status: Former Smoker    Start date: 08/25/1994  . Smokeless tobacco: Never Used  Substance and Sexual Activity  . Alcohol use: No    Comment: Quit 12/2015  . Drug use: No  . Sexual activity: Not on file    Comment: Married  Other Topics Concern  . Not on file  Social History Narrative   Lives at home w/ husband and 3 children, also has 1 step-child   Right-handed   Caffeine: none since 05/2016    REVIEW OF SYSTEMS: Constitutional: No fevers, chills, or sweats, no generalized fatigue, change in appetite Eyes: No visual changes, double vision, eye pain Ear, nose  and throat: No hearing loss, ear pain, nasal congestion, sore throat Cardiovascular: No chest pain, palpitations Respiratory:  No shortness of breath at rest or with exertion, wheezes GastrointestinaI: No nausea, vomiting, diarrhea, abdominal pain, fecal incontinence Genitourinary:  No dysuria, urinary retention or frequency Musculoskeletal:  + neck pain,no back pain Integumentary: No rash, pruritus, skin lesions Neurological: as above Psychiatric: No depression, insomnia, anxiety Endocrine: No palpitations, fatigue, diaphoresis, mood swings, change in appetite, change in weight, increased thirst Hematologic/Lymphatic:  No anemia, purpura, petechiae. Allergic/Immunologic: no itchy/runny eyes, nasal congestion, recent allergic reactions, rashes  PHYSICAL EXAM: Vitals:   12/16/17 1002  BP: 100/60  Pulse: 73  SpO2: 99%   General: No acute distress Head:  Normocephalic/atraumatic Neck: supple, no paraspinal tenderness, full range of motion Heart:  Regular rate and rhythm Lungs:  Clear to auscultation bilaterally Back: No paraspinal tenderness Skin/Extremities: No rash,  no edema Neurological Exam: alert and oriented to person, place, and time. No aphasia or dysarthria. Fund of knowledge is appropriate.  Recent and remote memory are intact.  Attention and concentration are normal.    Able to name objects and repeat phrases. Cranial nerves: Pupils equal, round, reactive to light. Fundoscopic exam unremarkable, no papilledema. Extraocular movements intact with no nystagmus. Visual fields full. Facial sensation intact. No facial asymmetry. Tongue, uvula, palate midline.  Motor: Bulk and tone normal, muscle strength 5/5 throughout with no pronator drift.  Sensation to light touch intact.  No extinction to double simultaneous stimulation.  Deep tendon reflexes brisk +2 throughout, toes downgoing.  Finger to nose testing intact.  Gait narrow-based and steady, able to tandem walk adequately.  Romberg negative.  IMPRESSION: This is a 41 yo RH woman with a history of anxiety and migraines since childhood, who presented with a change in headaches, now with associated nausea, vomiting, and diarrhea. MRI brain normal. Her eye exam at her optometrist office showed slight blurred disks bilaterally, lumbar puncture showed normal opening pressure of 18 cm H2O. She has seen ophthalmology, there is no optic nerve disc edema seen. No evidence of pseudotumor cerebri at this point. She had side effects on Topamax. She is on nortriptyline 100mg  qhs and gabapentin 200mg  qhs with no side effects. She continues to report 4-5 migraines a week and will increase gabapentin to 300mg  qhs. We may start reducing nortriptyline in the future if the additional gabapentin makes her drowsy. Continue prn Migranal, minimize use to 2-3 times a week. Continue migraine calendar, she will try polarized glasses to help with sensitivity to fluorescent lights. Follow-up in 4 months, she knows to call for any changes  Thank you for allowing me to participate in her care.  Please do not hesitate to call for any questions or  concerns.  The duration of this appointment visit was 25 minutes of face-to-face time with the patient.  Greater than 50% of this time was spent in counseling, explanation of diagnosis, planning of further management, and coordination of care.   Patrcia Dolly, M.D.   CC: Dr. Jeanice Lim

## 2017-12-21 ENCOUNTER — Other Ambulatory Visit: Payer: Self-pay | Admitting: Neurology

## 2017-12-21 MED ORDER — CYANOCOBALAMIN 1000 MCG/ML IJ SOLN: 1000.0000 ug | INTRAMUSCULAR | 11 refills | Status: DC

## 2017-12-23 ENCOUNTER — Encounter: Payer: Self-pay | Admitting: Neurology

## 2018-01-18 ENCOUNTER — Ambulatory Visit: Payer: Medicaid Other | Admitting: Neurology

## 2018-01-24 ENCOUNTER — Other Ambulatory Visit: Payer: Self-pay | Admitting: Obstetrics and Gynecology

## 2018-01-24 DIAGNOSIS — N6310 Unspecified lump in the right breast, unspecified quadrant: Secondary | ICD-10-CM

## 2018-01-27 ENCOUNTER — Ambulatory Visit
Admission: RE | Admit: 2018-01-27 | Discharge: 2018-01-27 | Disposition: A | Discharge status: Home / Self Care | Payer: Medicaid Other | Source: Ambulatory Visit | Attending: Obstetrics and Gynecology | Admitting: Obstetrics and Gynecology

## 2018-01-27 ENCOUNTER — Other Ambulatory Visit: Payer: Self-pay | Admitting: Obstetrics and Gynecology

## 2018-01-27 DIAGNOSIS — N6452 Nipple discharge: Secondary | ICD-10-CM

## 2018-01-27 DIAGNOSIS — N6310 Unspecified lump in the right breast, unspecified quadrant: Secondary | ICD-10-CM

## 2018-01-27 LAB — HM MAMMOGRAPHY

## 2018-03-31 ENCOUNTER — Ambulatory Visit: Payer: Self-pay | Admitting: Neurology

## 2018-03-31 ENCOUNTER — Encounter: Payer: Self-pay | Admitting: Neurology

## 2018-03-31 ENCOUNTER — Other Ambulatory Visit: Payer: Self-pay

## 2018-03-31 VITALS — BP 98/80 | HR 81 | Ht 63.0 in | Wt 176.0 lb

## 2018-03-31 DIAGNOSIS — G43109 Migraine with aura, not intractable, without status migrainosus: Secondary | ICD-10-CM

## 2018-03-31 DIAGNOSIS — G43709 Chronic migraine without aura, not intractable, without status migrainosus: Secondary | ICD-10-CM

## 2018-03-31 DIAGNOSIS — IMO0002 Reserved for concepts with insufficient information to code with codable children: Secondary | ICD-10-CM

## 2018-03-31 MED ORDER — GABAPENTIN 100 MG PO CAPS: ORAL_CAPSULE | ORAL | 6 refills | Status: DC

## 2018-03-31 MED ORDER — NORTRIPTYLINE HCL 50 MG PO CAPS: ORAL_CAPSULE | ORAL | 11 refills | Status: DC

## 2018-03-31 NOTE — Progress Notes (Signed)
NEUROLOGY FOLLOW UP OFFICE NOTE  Sylvia [name] [id number]  DOB: [birth year]  HISTORY OF PRESENT ILLNESS: I had the pleasure of seeing Sylvia [name] in follow-up in the neurology clinic on 03/31/2018.  The patient was last seen 3 months ago for worsening migraines. Since her last visit, gabapentin dose was increased to 300mg  qhs. She reports migraines have decreased a little to 2-3 a week or 3-4 a week (previously 4-5 a week). She is also taking nortriptyline 100mg  qhs. She notes that migraines are definitely better with the Theraspecs, but she cannot wear it all the time. Triggers are the same (bright lights), she continues to try to avoid triggers. She has had 2 falls with migraines, she got very dizzy with tinnitus and her balance was off. She denies any vision changes, dizziness, focal numbness/tingling/weakness. She fell and slipped on snow last month, no significant injuries. She continues on B12 injections.  HPI 11/25/2016: This is a pleasant 42 yo RH woman with a history of anxiety who presented for second opinion. She has had a history of migraines since she was 42 years old where she would feel sick to her stomach, however over the past 3 years, headaches have changed, she now had headaches with associated nausea, vomiting, and diarrhea. The headaches are different, she knows now they are coming on, she cannot look at the phone or computer without triggering a headache. Headaches start in the occipital region then radiates forward, starting with a pressure/squeezing sensation, then stabbing pain behind her eyes. She would see floating dots and become sensitive to lights and sounds. She would have profuse diarrhea when the headaches start. She had been taking Ambien and Xanax but weaned off then thinking these were causing the diarrhea. Headaches occur 4-5 times a week lasting for several hours, she would go to a dark room and take Ibuprofen 800mg  which bring the headaches  from a 10/10 to 7/10, but still have throbbing and light sensitivity. She cannot drive at night or in direct sunlight, fluorescent lights bother her. She feels her right eye would tear, and hears a long doorbell for 30-45 minutes. When the pressure builds up, she feels her heart beat in her ears that lasts the duration of the headache. She has double vision and feels equilibrium is off with the headaches. Her neck feels tight. She had seen neurologist Dr. Anne HahnWillis last September and had brain imaging which I personally reviewed. MRI brain without contrast was normal. She saw her optometrist Dr. Conley RollsLe who noted slight blurred disk OU with cup-to-disc ratio of 0.4/0.4 OD and 0.4/0.4 OS. She had a lumbar puncture with normal opening pressure of 18 cm H2O in left lateral decubitus position. She was given a Prednisone dosepak and started on Topamax, currently on 75mg  qhs, but states that after taking it for 2-1/2 weeks, she started feeling suicidal. She is still having suicidal thoughts, no plan, and is now seeing a therapist. She denies any prior history of depression or suicidal ideation. She has 3 children and has been on leave from work due to the headaches since 08/11/16. She also notices tingling in her fingers and bottom of feet since starting Topamax. She denies any falls, no back pain, bowel/bladder dysfunction. She usually gets 4-5 hours of sleep. Her paternal cousin has migraines. She has not tried any other migraine medications  Records and images were personally reviewed where available.  She was noted to have blurred disks OU at her optometrist office. She had an LP with normal  opening pressure. She has been evaluated by ophthalmologist Dr. Alben Spittle, there was no optic disc edema seen, unlikely to have IIH.   PAST MEDICAL HISTORY: Past Medical History:  Diagnosis Date  . Anxiety   . Common migraine with intractable migraine 08/25/2016  . Migraine     MEDICATIONS: Current Outpatient Medications on File  Prior to Visit  Medication Sig Dispense Refill  . ALPRAZolam (XANAX) 0.25 MG tablet Take 0.25 mg by mouth at bedtime as needed for anxiety.    . citalopram (CELEXA) 20 MG tablet Take 10 mg by mouth daily.     . cyanocobalamin (,VITAMIN B-12,) 1000 MCG/ML injection Inject 1 mL (1,000 mcg total) into the muscle every 30 (thirty) days. 1 mL 11  . dihydroergotamine (MIGRANAL) 4 MG/ML nasal spray Use in one nostril as directed.  Do not use more than 2-3 times a week 8 mL 12  . diphenhydrAMINE (BENADRYL) 25 MG tablet Take 25 mg by mouth every 6 (six) hours as needed.    Marland Kitchen escitalopram (LEXAPRO) 20 MG tablet TK 1 T PO QAM  2  . gabapentin (NEURONTIN) 100 MG capsule Take 3 caps at night 90 capsule 6  . ibuprofen (ADVIL,MOTRIN) 200 MG tablet Take 200 mg by mouth every 6 (six) hours as needed.    . metoCLOPramide (REGLAN) 10 MG tablet Take 1 tablet (10 mg total) by mouth every 8 (eight) hours as needed for nausea or vomiting. 30 tablet 5  . nortriptyline (PAMELOR) 50 MG capsule Take 2 caps at night 60 capsule 11  . Syringe/Needle, Disp, (SYRINGE 3CC/25GX1") 25G X 1" 3 ML MISC 1 Units by Does not apply route See admin instructions. 25 each 0  . zolpidem (AMBIEN) 5 MG tablet Take 5 mg by mouth at bedtime as needed for sleep.     No current facility-administered medications on file prior to visit.     ALLERGIES: Allergies  Allergen Reactions  . Latex Hives  . Penicillins Hives    FAMILY HISTORY: Family History  Problem Relation Age of Onset  . Heart attack Father   . Heart disease Father     SOCIAL HISTORY: Social History   Socioeconomic History  . Marital status: Married    Spouse name: Not on file  . Number of children: 4  . Years of education: College  . Highest education level: Not on file  Occupational History  . Occupation: [employer]   Social Needs  . Financial resource strain: Not on file  . Food insecurity:    Worry: Not on file    Inability: Not on file  . Transportation needs:      Medical: Not on file    Non-medical: Not on file  Tobacco Use  . Smoking status: Former Smoker    Start date: 08/25/1994  . Smokeless tobacco: Never Used  Substance and Sexual Activity  . Alcohol use: No    Comment: Quit 12/2015  . Drug use: No  . Sexual activity: Not on file    Comment: Married  Lifestyle  . Physical activity:    Days per week: Not on file    Minutes per session: Not on file  . Stress: Not on file  Relationships  . Social connections:    Talks on phone: Not on file    Gets together: Not on file    Attends religious service: Not on file    Active member of club or organization: Not on file    Attends meetings of clubs or  organizations: Not on file    Relationship status: Not on file  . Intimate partner violence:    Fear of current or ex partner: Not on file    Emotionally abused: Not on file    Physically abused: Not on file    Forced sexual activity: Not on file  Other Topics Concern  . Not on file  Social History Narrative   Lives at home w/ husband and 3 children, also has 1 step-child   Right-handed   Caffeine: none since 05/2016    REVIEW OF SYSTEMS: Constitutional: No fevers, chills, or sweats, no generalized fatigue, change in appetite Eyes: No visual changes, double vision, eye pain Ear, nose and throat: No hearing loss, ear pain, nasal congestion, sore throat Cardiovascular: No chest pain, palpitations Respiratory:  No shortness of breath at rest or with exertion, wheezes GastrointestinaI: No nausea, vomiting, diarrhea, abdominal pain, fecal incontinence Genitourinary:  No dysuria, urinary retention or frequency Musculoskeletal:  + neck pain,no back pain Integumentary: No rash, pruritus, skin lesions Neurological: as above Psychiatric: No depression, insomnia, anxiety Endocrine: No palpitations, fatigue, diaphoresis, mood swings, change in appetite, change in weight, increased thirst Hematologic/Lymphatic:  No anemia, purpura,  petechiae. Allergic/Immunologic: no itchy/runny eyes, nasal congestion, recent allergic reactions, rashes  PHYSICAL EXAM: Vitals:   03/31/18 1320  BP: 98/80  Pulse: 81  SpO2: 97%   General: No acute distress Head:  Normocephalic/atraumatic Neck: supple, no paraspinal tenderness, full range of motion Heart:  Regular rate and rhythm Lungs:  Clear to auscultation bilaterally Back: No paraspinal tenderness Skin/Extremities: No rash, no edema Neurological Exam: alert and oriented to person, place, and time. No aphasia or dysarthria. Fund of knowledge is appropriate.  Recent and remote memory are intact.  Attention and concentration are normal.    Able to name objects and repeat phrases. Cranial nerves: Pupils equal, round, reactive to light. Fundoscopic exam unremarkable, no papilledema. Extraocular movements intact with no nystagmus. Visual fields full. Facial sensation intact. No facial asymmetry. Tongue, uvula, palate midline.  Motor: Bulk and tone normal, muscle strength 5/5 throughout with no pronator drift.  Sensation to light touch intact.  No extinction to double simultaneous stimulation.  Deep tendon reflexes brisk +2 throughout, toes downgoing.  Finger to nose testing intact.  Gait narrow-based and steady, able to tandem walk adequately.  Romberg negative.  IMPRESSION: This is a 42 yo RH woman with a history of anxiety and migraines since childhood, who presented with a change in headaches, now with associated nausea, vomiting, and diarrhea. MRI brain normal. Her eye exam at her optometrist office showed slight blurred disks bilaterally, lumbar puncture showed normal opening pressure of 18 cm H2O. She has seen ophthalmology, there is no optic nerve disc edema seen. No evidence of pseudotumor cerebri at this point. She had side effects on Topamax. She is on nortriptyline 100mg  qhs and gabapentin 300mg  qhs with some improvement. We discussed uptitrating gabapentin further to 400mg  qhs for a  month, and if no significant side effects, 500mg  qhs. She feels drowsy taking it in the daytime. Continue nortriptyline 100mg  qhs for now, she may taper to 50mg  qhs if higher gabapentin dose makes her too drowsy. Continue prn Migranal, minimize use to 2-3 times a week. Continue migraine calendar and trigger avoidance. Follow-up in 5-6 months, she knows to call for any changes  Thank you for allowing me to participate in her care.  Please do not hesitate to call for any questions or concerns.  The duration of this  appointment visit was 25 minutes of face-to-face time with the patient.  Greater than 50% of this time was spent in counseling, explanation of diagnosis, planning of further management, and coordination of care.   Patrcia Dolly, M.D.   CC: Dr. Jeanice Lim

## 2018-03-31 NOTE — Patient Instructions (Addendum)
1. Increase Gabapentin 100mg : Take 4 caps at night for a month, if tolerating it fine and still with same amount of headaches, increase to 5 caps at night 2. Continue nortriptyline 50mg  2 caps at night. If too drowsy with increase in gabapentin, may reduce dose to 1 cap at night 3.Continue trigger avoidance and migraine calendar 4. Follow-up in 5-6 months, call for any changes

## 2018-04-04 ENCOUNTER — Encounter: Payer: Self-pay | Admitting: Neurology

## 2018-04-24 ENCOUNTER — Encounter: Payer: Self-pay | Admitting: Family Medicine

## 2018-04-24 ENCOUNTER — Ambulatory Visit (INDEPENDENT_AMBULATORY_CARE_PROVIDER_SITE_OTHER): Payer: Self-pay | Admitting: Family Medicine

## 2018-04-24 ENCOUNTER — Other Ambulatory Visit: Payer: Self-pay

## 2018-04-24 VITALS — BP 122/60 | HR 76 | Temp 98.2°F | Resp 14 | Ht 63.0 in | Wt 179.0 lb

## 2018-04-24 DIAGNOSIS — E538 Deficiency of other specified B group vitamins: Secondary | ICD-10-CM

## 2018-04-24 DIAGNOSIS — F331 Major depressive disorder, recurrent, moderate: Secondary | ICD-10-CM

## 2018-04-24 DIAGNOSIS — Z0000 Encounter for general adult medical examination without abnormal findings: Secondary | ICD-10-CM

## 2018-04-24 DIAGNOSIS — F329 Major depressive disorder, single episode, unspecified: Secondary | ICD-10-CM | POA: Insufficient documentation

## 2018-04-24 DIAGNOSIS — G43019 Migraine without aura, intractable, without status migrainosus: Secondary | ICD-10-CM

## 2018-04-24 DIAGNOSIS — F411 Generalized anxiety disorder: Secondary | ICD-10-CM

## 2018-04-24 NOTE — Assessment & Plan Note (Signed)
Medications per psychiatry Obtain records

## 2018-04-24 NOTE — Patient Instructions (Addendum)
Release of records - [facility]  Release of records- [facility]  Release of records- Dr. Marlyne Beards  F/U let us know when insurance is on and labs and tdap will be done

## 2018-04-24 NOTE — Assessment & Plan Note (Signed)
Followed by neurology Reviewed medications

## 2018-04-24 NOTE — Progress Notes (Signed)
Subjective:    Patient ID: Sylvia [name], female    DOB: 1976/06/29, 42 y.o.   MRN: [id number]  Patient presents for New Patient CPE (is fasting)   Pt here to establish care and for CPE  Previous PCP-Dr. Cami Fulp in [place] at [facility]  [facility]- Dr. Marcelle Overlie , had hystrectoy, had lump in right breast, MRI and   Followed by Neurology- Dr. Karel Jarvis for migraines taking pamelor, migrainal spray for prn, Gabapentin  Reglan.  Her migraine issues worsen around her late 30s she was also having some hormonal changes at that time.  She does have history of migraines as a teenager.  GAD- currently on celexa, ativan, is followed by Dr. Marlyne Beards psychiatry she is been with him for the past few years.  During all of her migraine issues she has significant anxiety depression which she went on long-term disability for and was recently on Medicaid until there was some misunderstanding and her Medicaid was cut off 2 weeks ago.  States that this should be cut back on after she sees her caseworker.  She has not been hospitalized.  She also takes Ambien at bedtime because she cannot sleep.  She has significant stressors.  She do she does have 4 children aging from 1122 also have some difficulties with her husband.  She sees her psychiatrist every 3 months. She is trying some relaxing techniques to help with stress    Chronic insomnia- taking ambien   b12 DEF - taking b12 injections  4 total children 18- to age 62   Due TDAP    Maternal side in aunts- had breast cancer  Father- Side in extended family multiple cancers    Eye Doctor- Dr. Nedra Hai - [facility]   ( had workup for papilledema was negative)   Dentist - [employer]    Previously worked at [employer], Merchandiser, retail   Review Of Systems:  GEN- denies fatigue, fever, weight loss,weakness, recent illness HEENT- denies eye drainage, change in vision, nasal discharge, CVS- denies chest pain, palpitations RESP- denies SOB,  cough, wheeze ABD- denies N/V, change in stools, abd pain GU- denies dysuria, hematuria, dribbling, incontinence MSK- denies joint pain, muscle aches, injury Neuro- denies headache, dizziness, syncope, seizure activity       Objective:    BP 122/60   Pulse 76   Temp 98.2 F (36.8 C) (Oral)   Resp 14   Ht  (1.6 m)   Wt 179 lb (81.2 kg)   SpO2 100%   BMI 31.71 kg/m  GEN- NAD, alert and oriented x3 HEENT- PERRL, EOMI, non injected sclera, pink conjunctiva, MMM, oropharynx clear Neck- Supple, no thyromegaly CVS- RRR, no murmur RESP-CTAB ABD-NABS,soft,NT,ND Psych- normal affect and mood EXT- No edema Pulses- Radial, DP- 2+        Assessment & Plan:      Problem List Items Addressed This Visit      Unprioritized   MDD (major depressive disorder)   GAD (generalized anxiety disorder)    Medications per psychiatry Obtain records       Common migraine with intractable migraine    Followed by neurology Reviewed medications       B12 deficiency    B12 shots given at home       Other Visit Diagnoses    Routine general medical examination at a health care facility    -  Primary   CPE done as uninsured, vitals stable, will obtain records from other specialist  she will return in a few weeks to have labs done and TDAP   Relevant Orders   CBC with Differential/Platelet   Comprehensive metabolic panel   Lipid panel   TSH      Note: This dictation was prepared with Dragon dictation along with smaller phrase technology. Any transcriptional errors that result from this process are unintentional.

## 2018-04-24 NOTE — Assessment & Plan Note (Signed)
B12 shots given at home

## 2018-05-02 ENCOUNTER — Encounter: Payer: Self-pay | Admitting: *Deleted

## 2018-09-18 ENCOUNTER — Encounter

## 2018-10-08 DIAGNOSIS — F332 Major depressive disorder, recurrent severe without psychotic features: Secondary | ICD-10-CM | POA: Insufficient documentation

## 2018-10-08 DIAGNOSIS — F4010 Social phobia, unspecified: Secondary | ICD-10-CM | POA: Insufficient documentation

## 2018-10-08 DIAGNOSIS — F4312 Post-traumatic stress disorder, chronic: Secondary | ICD-10-CM

## 2018-10-18 ENCOUNTER — Ambulatory Visit: Payer: Self-pay | Admitting: Psychiatry

## 2018-12-08 ENCOUNTER — Telehealth: Payer: Self-pay | Admitting: Neurology

## 2018-12-08 NOTE — Telephone Encounter (Signed)
Patient is calling in stating that the nose bleeds and vomiting is back. Please call her back at [phone]. Thanks!

## 2019-04-03 ENCOUNTER — Encounter

## 2019-04-03 ENCOUNTER — Ambulatory Visit: Payer: Self-pay | Admitting: Neurology

## 2019-08-30 ENCOUNTER — Other Ambulatory Visit: Payer: Self-pay

## 2019-08-30 DIAGNOSIS — Z20822 Contact with and (suspected) exposure to covid-19: Secondary | ICD-10-CM

## 2019-08-31 ENCOUNTER — Telehealth: Payer: Self-pay | Admitting: *Deleted

## 2019-08-31 LAB — NOVEL CORONAVIRUS, NAA: SARS-CoV-2, NAA: NOT DETECTED

## 2019-08-31 NOTE — Telephone Encounter (Signed)
Patient returned call from earlier today.  Notified her of negative COVID 19 test results from test done on 08/30/2019.

## 2019-09-21 ENCOUNTER — Ambulatory Visit (INDEPENDENT_AMBULATORY_CARE_PROVIDER_SITE_OTHER): Payer: No Typology Code available for payment source | Admitting: Psychiatry

## 2019-09-21 ENCOUNTER — Encounter: Payer: Self-pay | Admitting: Psychiatry

## 2019-09-21 ENCOUNTER — Other Ambulatory Visit: Payer: Self-pay

## 2019-09-21 DIAGNOSIS — F4310 Post-traumatic stress disorder, unspecified: Secondary | ICD-10-CM | POA: Diagnosis not present

## 2019-09-21 NOTE — Progress Notes (Signed)
      [facility] Counselor/Therapist Progress Note  Patient ID: Sylvia [name], MRN: [id number],    Date: 09/21/2019  Time Spent: 51 minutes start time 10:09AM end time 11 AM  Treatment Type: Individual Therapy  Reported Symptoms: anxiety, panic attacks, triggered responses, headaches, sleep issues, nightmares  Mental Status Exam:  Appearance:   Well Groomed     Behavior:  Appropriate  Motor:  Normal  Speech/Language:   Normal Rate  Affect:  Appropriate  Mood:  anxious  Thought process:  normal  Thought content:    WNL  Sensory/Perceptual disturbances:    WNL  Orientation:  oriented to person, place, time/date and situation  Attention:  Good  Concentration:  Good  Memory:  WNL  Fund of knowledge:   Good  Insight:    Good  Judgment:   Good  Impulse Control:  Good   Risk Assessment: Danger to Self:  No Self-injurious Behavior: No Danger to Others: No Duty to Warn:no Physical Aggression / Violence:No  Access to Firearms a concern: No  Gang Involvement:No   Subjective: Patient was present for session.  She reported that she had stropped due to financial reasons and now she is able to return so she wants to get back to working through unresolved traumas. Her dad passed in March and she has 2 granddaughters now 1 that she has custody of currently.  Patient went on to share she has graduated from school and is planning on taking a new position soon and that is a positive in her life.  Patient shared that she continues to have migraines but they are becoming more manageable and she is hoping that they will not get in the way of her doing her job.  Patient shared that she continues to use CBT and coping skills that were taught to her first round in treatment.  Develop treatment goal for current situation and agreed to get started on it at next session.  Patient explained that DSS is already involved with her 102-day old granddaughter and she is concerned that they will be  contacting her.  Discussed how to talk to them about the situation and the importance of her recognizing what her limits were so that she can continue to move forward in her own healing.  Interventions: Solution-Oriented/Positive Psychology  Diagnosis:   ICD-10-CM   1. PTSD (post-traumatic stress disorder)  F43.10     Plan: Patient is to continue utilizing CBT and coping skills that have helped her manage her emotions appropriately over the past year.  Patient is to get back to exercising to release emotions from her body appropriately.  Long-term goal: Display a full range of emotions without experiencing loss of control Short-term goal: Sleep without being disturbed by dreams of the trauma Verbalize hopeful and positive statements regarding the future  Lina Sayre, [facility]

## 2019-10-18 ENCOUNTER — Ambulatory Visit: Payer: No Typology Code available for payment source | Admitting: Psychiatry

## 2020-03-05 ENCOUNTER — Other Ambulatory Visit: Payer: Self-pay | Admitting: Obstetrics and Gynecology

## 2020-03-05 DIAGNOSIS — Z1231 Encounter for screening mammogram for malignant neoplasm of breast: Secondary | ICD-10-CM

## 2020-03-26 ENCOUNTER — Ambulatory Visit
Admission: RE | Admit: 2020-03-26 | Discharge: 2020-03-26 | Disposition: A | Discharge status: Home / Self Care | Payer: PRIVATE HEALTH INSURANCE | Source: Ambulatory Visit | Attending: Obstetrics and Gynecology | Admitting: Obstetrics and Gynecology

## 2020-03-26 ENCOUNTER — Other Ambulatory Visit: Payer: Self-pay | Admitting: Obstetrics and Gynecology

## 2020-03-26 ENCOUNTER — Other Ambulatory Visit: Payer: Self-pay

## 2020-03-26 DIAGNOSIS — N63 Unspecified lump in unspecified breast: Secondary | ICD-10-CM

## 2020-03-26 DIAGNOSIS — Z1231 Encounter for screening mammogram for malignant neoplasm of breast: Secondary | ICD-10-CM

## 2020-05-05 ENCOUNTER — Other Ambulatory Visit: Payer: Self-pay

## 2020-05-05 ENCOUNTER — Ambulatory Visit
Admission: RE | Admit: 2020-05-05 | Discharge: 2020-05-05 | Disposition: A | Discharge status: Home / Self Care | Payer: PRIVATE HEALTH INSURANCE | Source: Ambulatory Visit | Attending: Obstetrics and Gynecology | Admitting: Obstetrics and Gynecology

## 2020-05-05 DIAGNOSIS — N63 Unspecified lump in unspecified breast: Secondary | ICD-10-CM

## 2020-10-09 ENCOUNTER — Encounter: Payer: Self-pay | Admitting: Psychiatry

## 2022-03-01 ENCOUNTER — Emergency Department (HOSPITAL_COMMUNITY)
Admission: EM | Admit: 2022-03-01 | Discharge: 2022-03-01 | Disposition: A | Discharge status: Home / Self Care | Payer: No Typology Code available for payment source | Attending: Emergency Medicine | Admitting: Emergency Medicine

## 2022-03-01 ENCOUNTER — Other Ambulatory Visit: Payer: Self-pay

## 2022-03-01 ENCOUNTER — Emergency Department (HOSPITAL_COMMUNITY): Payer: No Typology Code available for payment source

## 2022-03-01 ENCOUNTER — Encounter (HOSPITAL_COMMUNITY): Payer: Self-pay

## 2022-03-01 DIAGNOSIS — R079 Chest pain, unspecified: Secondary | ICD-10-CM

## 2022-03-01 DIAGNOSIS — R0789 Other chest pain: Secondary | ICD-10-CM | POA: Insufficient documentation

## 2022-03-01 DIAGNOSIS — R072 Precordial pain: Secondary | ICD-10-CM

## 2022-03-01 DIAGNOSIS — Z91040 Latex allergy status: Secondary | ICD-10-CM | POA: Insufficient documentation

## 2022-03-01 LAB — CBC WITH DIFFERENTIAL/PLATELET
Abs Immature Granulocytes: 0.03 10*3/uL (ref 0.00–0.07)
Basophils Absolute: 0.1 10*3/uL (ref 0.0–0.1)
Basophils Relative: 1 %
Eosinophils Absolute: 0.1 10*3/uL (ref 0.0–0.5)
Eosinophils Relative: 1 %
HCT: 41.7 % (ref 36.0–46.0)
Hemoglobin: 13.5 g/dL (ref 12.0–15.0)
Immature Granulocytes: 0 %
Lymphocytes Relative: 28 %
Lymphs Abs: 2.6 10*3/uL (ref 0.7–4.0)
MCH: 28.9 pg (ref 26.0–34.0)
MCHC: 32.4 g/dL (ref 30.0–36.0)
MCV: 89.3 fL (ref 80.0–100.0)
Monocytes Absolute: 0.7 10*3/uL (ref 0.1–1.0)
Monocytes Relative: 8 %
Neutro Abs: 5.6 10*3/uL (ref 1.7–7.7)
Neutrophils Relative %: 62 %
Platelets: 217 10*3/uL (ref 150–400)
RBC: 4.67 MIL/uL (ref 3.87–5.11)
RDW: 13 % (ref 11.5–15.5)
WBC: 9 10*3/uL (ref 4.0–10.5)
nRBC: 0 % (ref 0.0–0.2)

## 2022-03-01 LAB — BASIC METABOLIC PANEL
Anion gap: 7 (ref 5–15)
BUN: 9 mg/dL (ref 6–20)
CO2: 25 mmol/L (ref 22–32)
Calcium: 8.9 mg/dL (ref 8.9–10.3)
Chloride: 106 mmol/L (ref 98–111)
Creatinine, Ser: 0.81 mg/dL (ref 0.44–1.00)
GFR, Estimated: >60 mL/min (ref >60)
Glucose, Bld: 59 mg/dL — ABNORMAL LOW (ref 70–99)
Potassium: 3.4 mmol/L — ABNORMAL LOW (ref 3.5–5.1)
Sodium: 138 mmol/L (ref 135–145)

## 2022-03-01 LAB — TROPONIN I (HIGH SENSITIVITY)
Troponin I (High Sensitivity): <2 ng/L (ref <18)
Troponin I (High Sensitivity): <2 ng/L (ref <18)

## 2022-03-01 LAB — D-DIMER, QUANTITATIVE: D-Dimer, Quant: 0.35 ug/mL-FEU (ref 0.00–0.50)

## 2022-03-01 MED ORDER — NITROGLYCERIN 0.4 MG SL SUBL
0.4000 mg | SUBLINGUAL_TABLET | Freq: Once | SUBLINGUAL | Status: AC
  Administered 2022-03-01: 0.4 mg via SUBLINGUAL
  Filled 2022-03-01: qty 1

## 2022-03-01 NOTE — Discharge Instructions (Addendum)
Follow-up as instructed by the cardiologist ?

## 2022-03-01 NOTE — ED Provider Notes (Signed)
? ?[facility]  ?Provider Note ? ?CSN: [id number] ?Arrival date & time: 03/01/22 0444 ? ?History ?Chief Complaint  ?Patient presents with  ? Chest Pain  ? ? ?Sylvia [name] is a 46 y.o. female with no significant PMH works as an Therapist, sports on the inpatient unit here. She reports on her way to work tonight she began having some left upper chest discomfort radiating into her L arm, not associated with SOB, nausea, or diaphoresis. Her symptoms continued during her shift prompting her to come to the ED for evaluation. No history of CAD, HTN, DM, HLD, does not smoke. No recent travel or leg swelling.  ? ? ?Home Medications ?Prior to Admission medications   ?Medication Sig Start Date End Date Taking? Authorizing Provider  ?ALPRAZolam (XANAX) 1 MG tablet Take 1 mg by mouth 3 (three) times daily.     [provider]  ?citalopram (CELEXA) 20 MG tablet Take 20 mg by mouth every morning.     [provider]  ?cyanocobalamin (,VITAMIN B-12,) 1000 MCG/ML injection Inject 1 mL (1,000 mcg total) into the muscle every 30 (thirty) days. 12/21/17   Cameron Sprang, MD  ?dihydroergotamine [facility]) 4 MG/ML nasal spray Use in one nostril as directed.  Do not use more than 2-3 times a week 10/19/17   Cameron Sprang, MD  ?diphenhydrAMINE (BENADRYL) 25 MG tablet Take 25 mg by mouth every 6 (six) hours as needed.    [provider]  ?gabapentin (NEURONTIN) 100 MG capsule Take 5 caps at night 03/31/18   Cameron Sprang, MD  ?ibuprofen (ADVIL,MOTRIN) 200 MG tablet Take 200 mg by mouth every 6 (six) hours as needed.    [provider]  ?metoCLOPramide (REGLAN) 10 MG tablet Take 1 tablet (10 mg total) by mouth every 8 (eight) hours as needed for nausea or vomiting. 06/29/17   Cameron Sprang, MD  ?nortriptyline (PAMELOR) 50 MG capsule Take 2 caps at night 03/31/18   Cameron Sprang, MD  ?Syringe/Needle, Disp, (SYRINGE 3CC/25GX1") 25G X 1" 3 ML MISC 1 Units by Does not apply route See admin  instructions. 10/06/17   Cameron Sprang, MD  ?zolpidem (AMBIEN) 10 MG tablet Take 10 mg by mouth at bedtime.     [provider]  ? ? ? ?Allergies    ?Topamax [topiramate], Latex, and Penicillins ? ? ?Review of Systems   ?Review of Systems ?Please see HPI for pertinent positives and negatives ? ?Physical Exam ?BP 104/73   Pulse 76   Resp 15   Ht 5\' 3"  (1.6 m)   Wt 73.5 kg   SpO2 100%   BMI 28.70 kg/m?  ? ?Physical Exam ?Vitals and nursing note reviewed.  ?Constitutional:   ?   Appearance: Normal appearance.  ?HENT:  ?   Head: Normocephalic and atraumatic.  ?   Nose: Nose normal.  ?   Mouth/Throat:  ?   Mouth: Mucous membranes are moist.  ?Eyes:  ?   Extraocular Movements: Extraocular movements intact.  ?   Conjunctiva/sclera: Conjunctivae normal.  ?Cardiovascular:  ?   Rate and Rhythm: Normal rate.  ?Pulmonary:  ?   Effort: Pulmonary effort is normal.  ?   Breath sounds: Normal breath sounds.  ?Chest:  ?   Chest wall: Tenderness (mild, left upper) present.  ?Abdominal:  ?   General: Abdomen is flat.  ?   Palpations: Abdomen is soft.  ?   Tenderness: There is no abdominal tenderness.  ?Musculoskeletal:     ?  General: No swelling. Normal range of motion.  ?   Cervical back: Neck supple.  ?   Right lower leg: No edema.  ?   Left lower leg: No edema.  ?Skin: ?   General: Skin is warm and dry.  ?Neurological:  ?   General: No focal deficit present.  ?   Mental Status: She is alert.  ?Psychiatric:     ?   Mood and Affect: Mood normal.  ? ? ?ED Results / Procedures / Treatments   ?EKG ?EKG Interpretation ? ?Date/Time:  Monday March 01 2022 04:53:12 EDT ?Ventricular Rate:  78 ?PR Interval:  123 ?QRS Duration: 89 ?QT Interval:  379 ?QTC Calculation: 432 ?R Axis:   70 ?Text Interpretation: Sinus rhythm Anteroseptal infarct, age indeterminate Minimal ST depression, inferior leads Since last tracing Non-specific ST-t changes Confirmed by Calvert Cantor [phone]) on 03/01/2022 4:56:07  AM ? ?Procedures ?Procedures ? ?Medications Ordered in the ED ?Medications - No data to display ? ?Initial Impression and Plan ? Patient with nonspecific chest pain. Low risk factor profile but she also has some nonspecific ST changes on EKG compared to 2018. Will check labs, CXR and continue to monitor. She is currently NSR on cardiac monitor.  ? ?ED Course  ? ?Clinical Course as of 03/01/22 0701  ?Mon Mar 01, 2022  ?0528 I personally viewed the images from radiology studies and agree with radiologist interpretation: CXR is clear ? [CS]  ?0542 BMP and Trop are normal.  [CS]  ?0600 CBC is normal.  [CS]  ?[phone] Care of the patient signed out to Dr. Roderic Palau at the change of shift pending second Trop.  [CS]  ?  ?Clinical Course User Index ?[CS] Truddie Hidden, MD  ? ? ? ?MDM Rules/Calculators/A&P ?Medical Decision Making ?Problems Addressed: ?Chest pain, unspecified type: acute illness or injury ? ?Amount and/or Complexity of Data Reviewed ?Labs: ordered. Decision-making details documented in ED Course. ?Radiology: ordered and independent interpretation performed. Decision-making details documented in ED Course. ?ECG/medicine tests: ordered and independent interpretation performed. Decision-making details documented in ED Course. ? ?Risk ?Decision regarding hospitalization. ? ? ? ?Final Clinical Impression(s) / ED Diagnoses ?Final diagnoses:  ?Chest pain, unspecified type  ? ? ?Rx / DC Orders ?ED Discharge Orders   ? ? None  ? ?  ? ?  ?Truddie Hidden, MD ?03/01/22 0701 ? ?

## 2022-03-01 NOTE — Consult Note (Addendum)
Cardiology Consultation:   Patient ID: Delorise ShinerDemitra Richardson-Jones MRN: [id number]; DOB: [birth year]  Admit date: 03/01/2022 Date of Consult: 03/01/2022  PCP:  Salley Scarleturham, Kawanta F, MD   [facility] Providers Cardiologist:  New  Patient Profile:   Delorise ShinerDemitra Richardson-Jones is a 46 y.o. female with no significant prior medical history who is being seen 03/01/2022 for the evaluation of chest pain at the request of Dr. Estell HarpinZammit.  History of Present Illness:   Ms. Gaspar ColaRichardson-Jones with no prior cardiac history. Family history positive for cardiac issues in her father in his 3770s. No history of HTN, HLD, DM2. No tobacco history.   Seen in 2018 for chest pain. She had a stress echo that was normal echo study, EF 70%.   The patient pretend to the ER 03/01/22 for chest pain. This started while driving yesterday. IT has been waxing and waning. Radiated intot he left arm.  It is worse with taking a deep breath in. No associated SOB, nausea, vomiting, diaphoresis. Denies recent LLE, orthopnea, pnd, fever, chills, illness.   In the ER BP 104/73, pulse 76, RR 15, 100%O2. Labs showed K3.4, otherwise normal. HS trop negative x 2. D-dimer negative. EKG with no ischemic changes. CXR unremarkable. Cardiology was consulted.    Past Medical History:  Diagnosis Date   Allergy    Anxiety    Common migraine with intractable migraine 08/25/2016   Migraine     Past Surgical History:  Procedure Laterality Date   LAPAROSCOPIC HYSTERECTOMY  2011   WISDOM TOOTH EXTRACTION Bilateral 2014     Home Medications:  Prior to Admission medications   Medication Sig Start Date End Date Taking? Authorizing Provider  ibuprofen (ADVIL,MOTRIN) 200 MG tablet Take 200 mg by mouth every 6 (six) hours as needed.   Yes [provider]  cyanocobalamin (,VITAMIN B-12,) 1000 MCG/ML injection Inject 1 mL (1,000 mcg total) into the muscle every 30 (thirty) days. Patient not taking: Reported on 03/01/2022 12/21/17   Van ClinesAquino, Karen  M, MD  dihydroergotamine [facility](MIGRANAL) 4 MG/ML nasal spray Use in one nostril as directed.  Do not use more than 2-3 times a week Patient not taking: Reported on 03/01/2022 10/19/17   Van ClinesAquino, Karen M, MD  gabapentin (NEURONTIN) 100 MG capsule Take 5 caps at night Patient not taking: Reported on 03/01/2022 03/31/18   Van ClinesAquino, Karen M, MD  metoCLOPramide (REGLAN) 10 MG tablet Take 1 tablet (10 mg total) by mouth every 8 (eight) hours as needed for nausea or vomiting. Patient not taking: Reported on 03/01/2022 06/29/17   Van ClinesAquino, Karen M, MD  Syringe/Needle, Disp, (SYRINGE 3CC/25GX1") 25G X 1" 3 ML MISC 1 Units by Does not apply route See admin instructions. Patient not taking: Reported on 03/01/2022 10/06/17   Van ClinesAquino, Karen M, MD    Inpatient Medications: Scheduled Meds:  Continuous Infusions:  PRN Meds:   Allergies:    Allergies  Allergen Reactions   Topamax [Topiramate]     Felt Suicidal   Latex Hives   Penicillins Hives    Social History:   Social History   Socioeconomic History   Marital status: Married    Spouse name: Not on file   Number of children: 3   Years of education: College   Highest education level: Not on file  Occupational History   Occupation: [employer]     Comment: on long term disability form [employer]    Employer: [employer]  Tobacco Use   Smoking status: Former    Types: Cigarettes  Start date: 08/25/1994   Smokeless tobacco: Never  Vaping Use   Vaping Use: Never used  Substance and Sexual Activity   Alcohol use: No    Comment: Quit 12/2015   Drug use: No   Sexual activity: Yes    Partners: Male    Comment: Married  Other Topics Concern   Not on file  Social History Narrative   Lives at home w/ husband and 3 children, also has 1 step-child   Right-handed   Caffeine: none since 05/2016   Social Determinants of Health   Financial Resource Strain: Not on file  Food Insecurity: Not on file  Transportation Needs: Not on file  Physical Activity: Not on file   Stress: Not on file  Social Connections: Not on file  Intimate Partner Violence: Not on file    Family History:    Family History  Problem Relation Age of Onset   Thyroid disease Mother    Heart attack Father        2015   Heart disease Father    COPD Father    Hypertension Father    Cancer Paternal Grandfather      ROS:  Please see the history of present illness.   All other ROS reviewed and negative.     Physical Exam/Data:   Vitals:   03/01/22 0900 03/01/22 0930 03/01/22 1000 03/01/22 1030  BP: 111/80 118/74 120/83 115/81  Pulse: 75 79 69 65  Resp: 15 14 17 13   Temp:      TempSrc:      SpO2: 100% 100% 100% 100%  Weight:      Height:       No intake or output data in the 24 hours ending 03/01/22 1124 Last 3 Weights 03/01/2022 04/24/2018 03/31/2018  Weight (lbs) 162 lb 179 lb 176 lb  Weight (kg) 73.483 kg 81.194 kg 79.833 kg  Some encounter information is confidential and restricted. Go to Review Flowsheets activity to see all data.     Body mass index is 28.7 kg/m.  General:  Well nourished, well developed, in no acute distress HEENT: normal Neck: no JVD Vascular: No carotid bruits; Distal pulses 2+ bilaterally Cardiac:  normal S1, S2; RRR; no murmur  Lungs:  clear to auscultation bilaterally, no wheezing, rhonchi or rales  Abd: soft, nontender, no hepatomegaly  Ext: no edema Musculoskeletal:  No deformities, BUE and BLE strength normal and equal Skin: warm and dry  Neuro:  CNs 2-12 intact, no focal abnormalities noted Psych:  Normal affect   EKG:  The EKG was personally reviewed and demonstrates: NSR, 78bpm, nonspecific ST/T wave changes  Telemetry:  Telemetry was personally reviewed and demonstrates:  NSR HR 70s  Relevant CV Studies:  Echo stress test 2018 Study Conclusions   - Stress ECG conclusions: There were no stress arrhythmias or    conduction abnormalities. The stress ECG was normal.  - Staged echo: Left ventricular ejection fraction was  normal at    rest and with stress. Normal echo stress  - Peak stress: The estimated LV ejection fraction was 70%.  - Target heart rate achieved. Appropriate blood pressure response    to exercise.   Impressions:   - Normal exercise stress echo study.   Laboratory Data:  High Sensitivity Troponin:   Recent Labs  Lab 03/01/22 0506 03/01/22 0706  TROPONINIHS <2 <2     Chemistry Recent Labs  Lab 03/01/22 0506  NA 138  K 3.4*  CL 106  CO2 25  GLUCOSE 59*  BUN 9  CREATININE 0.81  CALCIUM 8.9  GFRNONAA >60  ANIONGAP 7    No results for input(s): PROT, ALBUMIN, AST, ALT, ALKPHOS, BILITOT in the last 168 hours. Lipids No results for input(s): CHOL, TRIG, HDL, LABVLDL, LDLCALC, CHOLHDL in the last 168 hours.  Hematology Recent Labs  Lab 03/01/22 0506  WBC 9.0  RBC 4.67  HGB 13.5  HCT 41.7  MCV 89.3  MCH 28.9  MCHC 32.4  RDW 13.0  PLT 217   Thyroid No results for input(s): TSH, FREET4 in the last 168 hours.  BNPNo results for input(s): BNP, PROBNP in the last 168 hours.  DDimer  Recent Labs  Lab 03/01/22 0506  DDIMER 0.35     Radiology/Studies:  DG Chest 2 View  Result Date: 03/01/2022 CLINICAL DATA:  Chest pain. EXAM: CHEST - 2 VIEW COMPARISON:  PA Lat 12/29/2016. FINDINGS: The heart size and mediastinal contours are within normal limits. Both lungs are clear. The visualized skeletal structures are unremarkable. IMPRESSION: No active cardiopulmonary disease.  Stable chest. Electronically Signed   By: Almira Bar M.D.   On: 03/01/2022 05:23     Assessment and Plan:   Chest pain - presented for chest pain with mostly atypical features - HS troponin negative x2 - DDimer negative - CXR unremarkable - EKG with NSR and nonspecific ST/T wave changes - calcium score as outpatient with subsequent follow-up.  - can consider echo  - Needs note for work from ER. I will set up outpatient follow-up.   For questions or updates, please contact [facility] Please consult [email or web address] for contact info under    Signed, Cadence Ardelle Lesches  03/01/2022 11:24 AM   Patient seen and examined and agree with Cadence Fransico Michael, PA-C as detailed above.  In brief, the patient is a 46 year old female with no significant PMH who presented to the ER with chest pain for which Cardiology was consulted.   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 Pain was not exertional and worsened with deep inspiration. She presented to the ER where trops negative x2. ECG with NSR, no ischemic changes. D-dimer negative. Currently, her chest pain has improved.  Given reassuring work-up, atypical nature of chest pain and lack of risk factors, okay to discharge home with CV follow-up. Will plan for coronary calcium score as outpatient for risk stratification.  GEN: No acute distress.   Neck: No JVD Cardiac: RRR, no murmurs, rubs, or gallops.  Respiratory: Clear to auscultation bilaterally. GI: Soft, nontender, non-distended  MS: No edema; No deformity. Neuro:  Nonfocal  Psych: Normal affect    Plan: -Okay to discharge home -Will arrange CV follow-up with Ca score as patient would like to pursue Ca score over CTA at this time -Will arrange for CV follow-up  Laurance Flatten, MD

## 2022-03-01 NOTE — ED Notes (Signed)
ED Provider at bedside. 

## 2022-03-01 NOTE — ED Triage Notes (Signed)
Pt c/o left sided chest pain that began yesterday around 1845. States her left arm "feels tingly". Denies any other symptoms ?

## 2022-03-03 ENCOUNTER — Telehealth: Payer: Self-pay | Admitting: Cardiology

## 2022-03-03 NOTE — Telephone Encounter (Signed)
Looks like you consulted on this pt in the hospital on 3/13.  She's calling requesting a CT with contrast.  She's currently scheduled to see you 04/21/22.  Is there any testing you want to go ahead and do prior to seeing her in the office? ?

## 2022-03-03 NOTE — Telephone Encounter (Signed)
Patient is requesting an order for a CT. She was advised to have one while admitted at [facility]. She states she would prefer with contrast. Please assist. ?

## 2022-03-03 NOTE — Telephone Encounter (Signed)
I offered her either a calcium score or CTA coronaries for her chest pain. She initially opted for Ca score, but if she would like CTA coronary instead, we can do that. Do you mind placing the order for her? The indication is chest pain. ? ?Thank you so much!! ? ? ?

## 2022-03-04 ENCOUNTER — Encounter: Payer: Self-pay | Admitting: *Deleted

## 2022-03-04 MED ORDER — METOPROLOL TARTRATE 100 MG PO TABS: 100.0000 mg | ORAL_TABLET | Freq: Once | ORAL | 0 refills | Status: DC

## 2022-03-04 NOTE — Telephone Encounter (Signed)
Spoke with the pt.  She is wanting to proceed with getting a Cardiac CT done for chest pain. ?Pt aware I will place the order for the cardiac CT in the system and send a message to our pre-cert team and CT Scheduler [place], to call her back and arrange this test. ?Pt has updated BMET done at recent hospital stay on 3/13. ?Pt aware I will send her the Cardiac CT instructions for her to follow when her CT is scheduled via her mychart account.  ?Asked the pt what her resting HR usually is and she confirmed that it runs in the 70's. Pt aware I will send in metoprolol tartrate 100 mg po for one dose to take 2 hrs prior to her CT.  She is aware it will be in the CT instructions I will send her in [place].  ?Pt verbalized understanding and agrees with this plan. ? ? ? ? ? ? ?Your cardiac CT will be scheduled at one of the below locations:  ? ?[facility] ?[address] ?[place], [place] [address] ?[phone] ? ? ?If scheduled at [facility], please arrive at the [facility] and Children's Entrance (Entrance C2) of [facility] 30 minutes prior to test start time. ?You can use the FREE valet parking offered at entrance C (encouraged to control the heart rate for the test)  ?Proceed to the [facility] Radiology Department (first floor) to check-in and test prep. ? ?All radiology patients and guests should use entrance C2 at [facility], accessed from [facility], even though the hospital's physical address listed is [address]. ? ? ? ? ?Please follow these instructions carefully (unless otherwise directed): ? ? ?On the Night Before the Test: ?Be sure to Drink plenty of water. ?Do not consume any caffeinated/decaffeinated beverages or chocolate 12 hours prior to your test. ?Do not take any antihistamines 12 hours prior to your test. ? ?On the Day of the Test: ?Drink plenty of water until 1 hour prior to the test. ?Do not eat any food 4 hours prior to the test. ?You  may take your regular medications prior to the test.  ?Take metoprolol 100 mg by mouth  (Lopressor) two hours prior to test. ?FEMALES- please wear underwire-free bra if available, avoid dresses & tight clothing ? ?After the Test: ?Drink plenty of water. ?After receiving IV contrast, you may experience a mild flushed feeling. This is normal. ?On occasion, you may experience a mild rash up to 24 hours after the test. This is not dangerous. If this occurs, you can take Benadryl 25 mg and increase your fluid intake. ?If you experience trouble breathing, this can be serious. If it is severe call 911 IMMEDIATELY. If it is mild, please call our office. ? ?We will call to schedule your test 2-4 weeks out understanding that some insurance companies will need an authorization prior to the service being performed.  ? ?For non-scheduling related questions, please contact the cardiac imaging nurse navigator should you have any questions/concerns: ?Rockwell Alexandria, Cardiac Imaging Nurse Navigator ?Larey Brick, Cardiac Imaging Nurse Navigator ?[facility] ?Direct Office Dial: [phone]  ? ?For scheduling needs, including cancellations and rescheduling, please call [place], [phone]. ? ?

## 2022-03-04 NOTE — Addendum Note (Signed)
Addended by: Loa Socks on: 03/04/2022 08:18 AM ? ? Modules accepted: Orders ? ?

## 2022-03-05 NOTE — Telephone Encounter (Signed)
ct heart ?Received: Today ?Clent Ridges, LPN ?Scheduled 03/18/22 at 11:00  ? ?Thanks,  ?[place]  ?

## 2022-03-10 ENCOUNTER — Telehealth: Payer: Self-pay | Admitting: *Deleted

## 2022-03-10 NOTE — Telephone Encounter (Signed)
-----   Message from Deliah Boston, RN sent at 03/10/2022  6:04 AM EDT ----- ?Regarding: FW: ER follow-up ? ?----- Message ----- ?From: Darene Lamer, LPN ?Sent: 03/08/2022  10:03 AM EDT ?To: Cv Div Ch St Triage ?Subject: FW: ER follow-up                              ? ?Patient has appointment with Dr.Pemberton 05/03. ?Thanks! ?----- Message ----- ?From: Fransico Michael, Cadence H, PA-C ?Sent: 03/01/2022  12:13 PM EDT ?To: Cv Div Nl Triage ?Subject: ER follow-up                                  ? ?Pt seen in the ER for chest pain, she needs calcium score, please call and shedule this. Needs ER f/u with Pemberton in 3-4 weeks.  ? ? ? ? ?

## 2022-03-10 NOTE — Telephone Encounter (Signed)
All appts have already been scheduled.  ?Cardiac CT scheduled for 3/30 and follow-up with Dr. Shari Prows is on 5/3. ?

## 2022-03-17 ENCOUNTER — Telehealth (HOSPITAL_COMMUNITY): Payer: Self-pay | Admitting: Emergency Medicine

## 2022-03-17 NOTE — Telephone Encounter (Signed)
Reaching out to patient to offer assistance regarding upcoming cardiac imaging study; pt verbalizes understanding of appt date/time, parking situation and where to check in, pre-test NPO status and medications ordered, and verified current allergies; name and call back number provided for further questions should they arise ?Kelby Lotspeich RN Navigator Cardiac Imaging ?[facility] ?[phone] office ?[phone] cell ? ?Denies iv issues ?100mg metoprolol tartrate ?Arrival 1030 ? ?

## 2022-03-18 ENCOUNTER — Ambulatory Visit (HOSPITAL_COMMUNITY)
Admission: RE | Admit: 2022-03-18 | Discharge: 2022-03-18 | Disposition: A | Discharge status: Home / Self Care | Payer: No Typology Code available for payment source | Source: Ambulatory Visit | Attending: Cardiology | Admitting: Cardiology

## 2022-03-18 DIAGNOSIS — R072 Precordial pain: Secondary | ICD-10-CM | POA: Insufficient documentation

## 2022-03-18 MED ORDER — NITROGLYCERIN 0.4 MG SL SUBL
SUBLINGUAL_TABLET | SUBLINGUAL | Status: AC
  Filled 2022-03-18: qty 2

## 2022-03-18 MED ORDER — IOHEXOL 350 MG/ML SOLN
95.0000 mL | Freq: Once | INTRAVENOUS | Status: AC | PRN
  Administered 2022-03-18: 95 mL via INTRAVENOUS

## 2022-03-18 MED ORDER — NITROGLYCERIN 0.4 MG SL SUBL
0.8000 mg | SUBLINGUAL_TABLET | Freq: Once | SUBLINGUAL | Status: AC
  Administered 2022-03-18: 0.8 mg via SUBLINGUAL

## 2022-04-17 NOTE — Progress Notes (Deleted)
?Cardiology Office Note:   ? ?Date:  04/17/2022  ? ?ID:  Sylvia [name], DOB 1976-07-09, MRN [id number] ? ?PCP:  Salley Scarleturham, Kawanta F, MD ?  ?[facility] Providers ?Cardiologist:  None { ? ? ?Referring MD: Salley Scarleturham, Kawanta F, MD  ? ? ?History of Present Illness:   ? ?Sylvia [name] is a 46 y.o. female with a hx of anxiety and depression who presents to clinic for follow-up. ? ?Patient initially seen in the ER on 03/01/22 for an episode of chest pain radiating to the left arm. Symptoms were worse with inspiration and were not exertional in nature. Trop negative x2. D-dimer negative. ECG with no ischemic changes, CXR unremarkable. We recommended outpatient follow-up. ? ?In the interim, the patient underwent coronary CTA on 03/18/22 which showed no significant CAD. Ca score 0.  ? ?Today, *** ? ?Past Medical History:  ?Diagnosis Date  ? Allergy   ? Anxiety   ? Common migraine with intractable migraine 08/25/2016  ? Migraine   ? ? ?Past Surgical History:  ?Procedure Laterality Date  ? LAPAROSCOPIC HYSTERECTOMY  2011  ? WISDOM TOOTH EXTRACTION Bilateral 2014  ? ? ?Current Medications: ?No outpatient medications have been marked as taking for the 04/21/22 encounter (Appointment) with Meriam SpraguePemberton, Honour Schwieger E, MD.  ?  ? ?Allergies:   Topamax [topiramate], Latex, and Penicillins  ? ?Social History  ? ?Socioeconomic History  ? Marital status: Married  ?  Spouse name: Not on file  ? Number of children: 3  ? Years of education: College  ? Highest education level: Not on file  ?Occupational History  ? Occupation: [employer]   ?  Comment: on long term disability form [employer]  ?  Employer: [employer]  ?Tobacco Use  ? Smoking status: Former  ?  Types: Cigarettes  ?  Start date: 08/25/1994  ? Smokeless tobacco: Never  ?Vaping Use  ? Vaping Use: Never used  ?Substance and Sexual Activity  ? Alcohol use: No  ?  Comment: Quit 12/2015  ? Drug use: No  ? Sexual activity: Yes  ?  Partners: Male  ?  Comment: Married  ?Other Topics Concern  ?  Not on file  ?Social History Narrative  ? Lives at home w/ husband and 3 children, also has 1 step-child  ? Right-handed  ? Caffeine: none since 05/2016  ? ?Social Determinants of Health  ? ?Financial Resource Strain: Not on file  ?Food Insecurity: Not on file  ?Transportation Needs: Not on file  ?Physical Activity: Not on file  ?Stress: Not on file  ?Social Connections: Not on file  ?  ? ?Family History: ?The patient's ***family history includes COPD in her father; Cancer in her paternal grandfather; Heart attack in her father; Heart disease in her father; Hypertension in her father; Thyroid disease in her mother. ? ?ROS:   ?Please see the history of present illness.    ?*** All other systems reviewed and are negative. ? ?EKGs/Labs/Other Studies Reviewed:   ? ?The following studies were reviewed today: ?Coronary CTA 03/18/22: ?FINDINGS: ?A 100 kV prospective scan was triggered in the descending thoracic ?aorta at 111 HU's. Axial non-contrast 3 mm slices were carried out ?through the heart. The data set was analyzed on a dedicated work ?station and scored using the Agatson method. Gantry rotation speed ?was 250 msecs and collimation was .6 mm. 0.8 mg of sl NTG was given. ?The 3D data set was reconstructed in 5% intervals of the 67-82 % of ?the R-R cycle. Diastolic phases were  analyzed on a dedicated work ?station using MPR, MIP and VRT modes. The patient received 80 cc of ?contrast. ?  ?Image quality: Good. Misregistration artifact in distal LAD segment. ?Does not affect read. ?  ?Aorta:  Normal size.  No calcifications.  No dissection. ?  ?Aortic Valve:  Trileaflet.  No calcifications. ?  ?Coronary Arteries:  Normal coronary origin.  Right dominance. ?  ?RCA is a large dominant artery that gives rise to PDA and PLA. There ?is no plaque. ?  ?Left main is a large artery that gives rise to LAD and LCX arteries. ?  ?LAD is a large vessel that has no plaque.  2 diagonal branches. ?  ?LCX is a non-dominant artery.  There  is no plaque. ?  ?Other findings: ?  ?Normal pulmonary vein drainage into the left atrium. ?  ?Normal left atrial appendage without a thrombus. ?  ?Normal size of the pulmonary artery. ?  ?Please see radiology report for non cardiac findings. ?  ?IMPRESSION: ?1. Coronary calcium score of 0. This was 0 percentile for age and ?sex matched control. ?  ?2. Normal coronary origin with right dominance. ?  ?3. No evidence of CAD. ? ?Echo stress test 2018 ?Study Conclusions  ? ?- Stress ECG conclusions: There were no stress arrhythmias or  ?  conduction abnormalities. The stress ECG was normal.  ?- Staged echo: Left ventricular ejection fraction was normal at  ?  rest and with stress. Normal echo stress  ?- Peak stress: The estimated LV ejection fraction was 70%.  ?- Target heart rate achieved. Appropriate blood pressure response  ?  to exercise.  ? ?Impressions:  ? ?- Normal exercise stress echo study.  ? ?EKG:  EKG is *** ordered today.  The ekg ordered today demonstrates *** ? ?Recent Labs: ?03/01/2022: BUN 9; Creatinine, Ser 0.81; Hemoglobin 13.5; Platelets 217; Potassium 3.4; Sodium 138  ?Recent Lipid Panel ?No results found for: CHOL, TRIG, HDL, CHOLHDL, VLDL, LDLCALC, LDLDIRECT ? ? ?Risk Assessment/Calculations:   ?{Does this patient have ATRIAL FIBRILLATION?:[phone]} ? ?    ? ?Physical Exam:   ? ?VS:  There were no vitals taken for this visit.   ? ?Wt Readings from Last 3 Encounters:  ?03/01/22 162 lb (73.5 kg)  ?04/24/18 179 lb (81.2 kg)  ?03/31/18 176 lb (79.8 kg)  ?  ? ?GEN: *** Well nourished, well developed in no acute distress ?HEENT: Normal ?NECK: No JVD; No carotid bruits ?LYMPHATICS: No lymphadenopathy ?CARDIAC: ***RRR, no murmurs, rubs, gallops ?RESPIRATORY:  Clear to auscultation without rales, wheezing or rhonchi  ?ABDOMEN: Soft, non-tender, non-distended ?MUSCULOSKELETAL:  No edema; No deformity  ?SKIN: Warm and dry ?NEUROLOGIC:  Alert and oriented x 3 ?PSYCHIATRIC:  Normal affect  ? ?ASSESSMENT:    ? ?No diagnosis found. ?PLAN:   ? ?In order of problems listed above: ? ?#Noncardiac Chest Pain: ?Patient with ER visit as described above for atypical chest pain with negative trops and d-dimer. Follow-up coronary CTA on 02/2022 showed no significant CAD, Ca score 0.  ?-Continue primary prevention with lifestyle modifications ?   ? ?{Are you ordering a CV Procedure (e.g. stress test, cath, DCCV, TEE, etc)?   Press F2        :[id number]}  ? ? ?Medication Adjustments/Labs and Tests Ordered: ?Current medicines are reviewed at length with the patient today.  Concerns regarding medicines are outlined above.  ?No orders of the defined types were placed in this encounter. ? ?No orders of the defined types  were placed in this encounter. ? ? ?There are no Patient Instructions on file for this visit.  ? ?Signed, ?Meriam Sprague, MD  ?04/17/2022 3:18 PM    ?[facility] ?

## 2022-04-21 ENCOUNTER — Encounter: Payer: Self-pay | Admitting: Cardiology

## 2022-04-21 ENCOUNTER — Ambulatory Visit: Payer: No Typology Code available for payment source | Admitting: Cardiology

## 2022-04-21 VITALS — BP 122/72 | HR 82 | Ht 63.0 in | Wt 182.2 lb

## 2022-04-21 DIAGNOSIS — R072 Precordial pain: Secondary | ICD-10-CM | POA: Diagnosis not present

## 2022-04-21 MED ORDER — AMLODIPINE BESYLATE 2.5 MG PO TABS: 2.5000 mg | ORAL_TABLET | Freq: Every day | ORAL | 2 refills | Status: DC

## 2022-04-21 MED ORDER — AMLODIPINE BESYLATE 2.5 MG PO TABS: 2.5000 mg | ORAL_TABLET | Freq: Every day | ORAL | 2 refills | Status: AC

## 2022-04-21 NOTE — Patient Instructions (Signed)
Medication Instructions:  ? ?START TAKING AMLODIPINE 2.5 MG BY MOUTH DAILY AT BEDTIME ? ?*If you need a refill on your cardiac medications before your next appointment, please call your pharmacy* ? ? ?Follow-Up: ?At [facility], you and your health needs are our priority.  As part of our continuing mission to provide you with exceptional heart care, we have created designated Provider Care Teams.  These Care Teams include your primary Cardiologist (physician) and Advanced Practice Providers (APPs -  Physician Assistants and Nurse Practitioners) who all work together to provide you with the care you need, when you need it. ? ?We recommend signing up for the patient portal called "MyChart".  Sign up information is provided on this After Visit Summary.  MyChart is used to connect with patients for Virtual Visits (Telemedicine).  Patients are able to view lab/test results, encounter notes, upcoming appointments, etc.  Non-urgent messages can be sent to your provider as well.   ?To learn more about what you can do with MyChart, go to [email or web address].   ? ?Your next appointment:   ?6 month(s) ? ?The format for your next appointment:   ?In Person ? ?Provider:   ?DR. PEMBERTON ? ?Important Information About Sugar ? ? ? ? ? ? ?

## 2022-04-21 NOTE — Progress Notes (Signed)
?Cardiology Office Note:   ? ?Date:  04/21/2022  ? ?ID:  Sylvia [name], DOB [birth year], MRN [id number] ? ?PCP:  No primary care provider on file. ?  ?[facility] Providers ?Cardiologist:  None { ? ? ?Referring MD: Salley Scarlet, MD  ? ? ?History of Present Illness:   ? ?Sylvia [name] is a 46 y.o. female with a hx of anxiety and depression who presents to clinic for follow-up. ? ?Patient initially seen in the ER on 03/01/22 for an episode of chest pain radiating to the left arm. Symptoms were worse with inspiration and were not exertional in nature. Trop negative x2. D-dimer negative. ECG with no ischemic changes, CXR unremarkable. We recommended outpatient follow-up. ? ?In the interim, the patient underwent coronary CTA on 03/18/22 which showed no significant CAD. Ca score 0.  ? ?Today, the patient states that she continues to suffer from chest pain/pressure that "comes and goes". She has felt the pain in her left chest/neck. She states she was able to feel her pulse in her neck. This is very concerning for her. She notices the chest pain at least 2 times a week. Last Tuesday her pain lasted for 1.5-2 hours. The other day her pain occurred while she was driving, but it can occur randomly at rest or with activity throughout the day. Previously before her visit to the ER she had associated lightheadedness. She was not lightheaded during her recent episodes. ? ?Typically she watches her diet, so she knows her chest pain is not associated with her meals. Lying flat on the floor helps relieve her pain. Of note, she experienced some of the discomfort in clinic today. She states that pressing on the area "doesn't hurt but doesn't help either". Typically it begins in her chest and radiates to her neck, as she can "feel it rising." ? ?Prior to her incident in March, she notes her blood pressure was frequently in the 90s. Afterwards she never sees readings below 120 systolic. Lately she is feeling  different, such as more tenseness/pressure in her body which she relates to her higher blood pressures. ? ?She endorses persistent fluid retention in her legs, which is controlled with wearing compression. She denies noticing any worsening edema. ? ?She also notes that she needs to prop her bed up while sleeping. ? ?She denies any palpitations, or shortness of breath. No lightheadedness, headaches, syncope, orthopnea, or PND. ? ?Of note, she believes she likely had COVID around February or March 2022. She lost her sense of taste, but her tests were always negative for COVID. ? ?Past Medical History:  ?Diagnosis Date  ? Allergy   ? Anxiety   ? Common migraine with intractable migraine 08/25/2016  ? Migraine   ? ? ?Past Surgical History:  ?Procedure Laterality Date  ? LAPAROSCOPIC HYSTERECTOMY  2011  ? WISDOM TOOTH EXTRACTION Bilateral 2014  ? ? ?Current Medications: ?Current Meds  ?Medication Sig  ? amLODipine (NORVASC) 2.5 MG tablet Take 1 tablet (2.5 mg total) by mouth at bedtime.  ?  ? ?Allergies:   Topamax [topiramate], Latex, and Penicillins  ? ?Social History  ? ?Socioeconomic History  ? Marital status: Married  ?  Spouse name: Not on file  ? Number of children: 3  ? Years of education: College  ? Highest education level: Not on file  ?Occupational History  ? Occupation: [employer]   ?  Comment: on long term disability form [employer]  ?  Employer: [employer]  ?Tobacco Use  ? Smoking  status: Former  ?  Types: Cigarettes  ?  Start date: 08/25/1994  ? Smokeless tobacco: Never  ?Vaping Use  ? Vaping Use: Never used  ?Substance and Sexual Activity  ? Alcohol use: No  ?  Comment: Quit 12/2015  ? Drug use: No  ? Sexual activity: Yes  ?  Partners: Male  ?  Comment: Married  ?Other Topics Concern  ? Not on file  ?Social History Narrative  ? Lives at home w/ husband and 3 children, also has 1 step-child  ? Right-handed  ? Caffeine: none since 05/2016  ? ?Social Determinants of Health  ? ?Financial Resource Strain: Not on file  ?Food  Insecurity: Not on file  ?Transportation Needs: Not on file  ?Physical Activity: Not on file  ?Stress: Not on file  ?Social Connections: Not on file  ?  ? ?Family History: ?The patient's family history includes COPD in her father; Cancer in her paternal grandfather; Heart attack in her father; Heart disease in her father; Hypertension in her father; Thyroid disease in her mother. ? ?ROS:   ?Review of Systems  ?Constitutional:  Negative for chills and fever.  ?HENT:  Negative for sore throat and tinnitus.   ?Eyes:  Negative for blurred vision.  ?Respiratory:  Negative for hemoptysis and shortness of breath.   ?Cardiovascular:  Positive for chest pain and leg swelling. Negative for palpitations, orthopnea, claudication and PND.  ?Gastrointestinal:  Negative for blood in stool and diarrhea.  ?Genitourinary:  Negative for frequency.  ?Musculoskeletal:  Negative for falls.  ?Neurological:  Negative for tremors and seizures.  ?Endo/Heme/Allergies:  Negative for environmental allergies.  ?Psychiatric/Behavioral:  Negative for substance abuse. The patient is not nervous/anxious.   ? ? ?EKGs/Labs/Other Studies Reviewed:   ? ?The following studies were reviewed today: ? ?Coronary CTA 03/18/22: ?FINDINGS: ?A 100 kV prospective scan was triggered in the descending thoracic ?aorta at 111 HU's. Axial non-contrast 3 mm slices were carried out ?through the heart. The data set was analyzed on a dedicated work ?station and scored using the Agatson method. Gantry rotation speed ?was 250 msecs and collimation was .6 mm. 0.8 mg of sl NTG was given. ?The 3D data set was reconstructed in 5% intervals of the 67-82 % of ?the R-R cycle. Diastolic phases were analyzed on a dedicated work ?station using MPR, MIP and VRT modes. The patient received 80 cc of ?contrast. ?  ?Image quality: Good. Misregistration artifact in distal LAD segment. ?Does not affect read. ?  ?Aorta:  Normal size.  No calcifications.  No dissection. ?  ?Aortic Valve:   Trileaflet.  No calcifications. ?  ?Coronary Arteries:  Normal coronary origin.  Right dominance. ?  ?RCA is a large dominant artery that gives rise to PDA and PLA. There ?is no plaque. ?  ?Left main is a large artery that gives rise to LAD and LCX arteries. ?  ?LAD is a large vessel that has no plaque.  2 diagonal branches. ?  ?LCX is a non-dominant artery.  There is no plaque. ?  ?Other findings: ?  ?Normal pulmonary vein drainage into the left atrium. ?  ?Normal left atrial appendage without a thrombus. ?  ?Normal size of the pulmonary artery. ?  ?Please see radiology report for non cardiac findings. ?  ?IMPRESSION: ?1. Coronary calcium score of 0. This was 0 percentile for age and ?sex matched control. ?  ?2. Normal coronary origin with right dominance. ?  ?3. No evidence of CAD. ? ?Echo stress  test 2018 ?Study Conclusions  ? ?- Stress ECG conclusions: There were no stress arrhythmias or  ?  conduction abnormalities. The stress ECG was normal.  ?- Staged echo: Left ventricular ejection fraction was normal at  ?  rest and with stress. Normal echo stress  ?- Peak stress: The estimated LV ejection fraction was 70%.  ?- Target heart rate achieved. Appropriate blood pressure response  ?  to exercise.  ? ?Impressions:  ? ?- Normal exercise stress echo study.  ? ?EKG:  EKG is personally reviewed. ?04/21/2022: EKG was not ordered. ?03/02/2022 (ED): Sinus rhythm at 78 bpm. Anteroseptal infarct, age indeterminate. Minimal ST depression, inferior leads. Since last racing Non-specific ST-t changes. ? ?Recent Labs: ?03/01/2022: BUN 9; Creatinine, Ser 0.81; Hemoglobin 13.5; Platelets 217; Potassium 3.4; Sodium 138  ? ?Recent Lipid Panel ?No results found for: CHOL, TRIG, HDL, CHOLHDL, VLDL, LDLCALC, LDLDIRECT ? ? ?Risk Assessment/Calculations:   ?  ? ?    ? ?Physical Exam:   ? ?VS:  BP 122/72   Pulse 82   Ht 5\' 3"  (1.6 m)   Wt 182 lb 3.2 oz (82.6 kg)   SpO2 97%   BMI 32.28 kg/m?    ? ?Wt Readings from Last 3 Encounters:   ?04/21/22 182 lb 3.2 oz (82.6 kg)  ?03/01/22 162 lb (73.5 kg)  ?04/24/18 179 lb (81.2 kg)  ?  ? ?GEN: Well nourished, well developed in no acute distress ?HEENT: Normal ?NECK: No JVD; No carotid bruits ?CARDIAC: RRR,

## 2022-04-21 NOTE — Addendum Note (Signed)
Addended by: Loa Socks on: 04/21/2022 02:41 PM ? ? Modules accepted: Orders ? ?

## 2022-06-14 ENCOUNTER — Telehealth: Payer: Self-pay | Admitting: Cardiology

## 2022-06-14 DIAGNOSIS — R0789 Other chest pain: Secondary | ICD-10-CM

## 2022-06-14 NOTE — Telephone Encounter (Signed)
Totally agree with you. Let's get her referred to GI for further work-up.

## 2022-06-15 NOTE — Telephone Encounter (Signed)
Pt aware that per Dr. Shari Prows, we will refer her to GI, for further work-up of non-cardiac related and atypical chest pain.  Pt had recent normal Cardiac CT.  Pt aware I will place the referral in the system and send a message to our [facility] Schedulers to give her a call back to arrange.   Pt verbalized understanding and agrees with this plan.

## 2022-07-01 ENCOUNTER — Telehealth: Payer: Self-pay | Admitting: *Deleted

## 2022-07-01 NOTE — Telephone Encounter (Signed)
-----   Message from Pricilla Holm sent at 06/30/2022 11:04 AM EDT ----- Regarding: RE: pt needs referral to GI  per Dr. Shari Prows Routed to [facility] for GI and they will call patient with the appointment  ----- Message ----- From: Loa Socks, LPN Sent: 0/23/3435   5:07 PM EDT To: Pricilla Holm; Cv Div Ch St Pcc Subject: FW: pt needs referral to GI  per Dr. Ilean Skill   ----- Message ----- From: Loa Socks, LPN Sent: 6/86/1683   8:44 AM EDT To: Pricilla Holm; Cv Div [facility] Pcc Subject: pt needs referral to GI  per Dr. Shari Prows     Dr. Shari Prows referred this pt to [facility] for non-cardiac related chest pain.  She had a normal cardiac work-up with [place], and now needs to be evaluated by GI.  Referral is in the system.  Pt aware you will call her to arrange new pt appt.  Can you please call and schedule then shoot me the date for follow-up?  Thank you for all you do and more, Nene Aranas

## 2022-10-04 ENCOUNTER — Encounter: Payer: Self-pay | Admitting: Adult Health

## 2022-10-04 ENCOUNTER — Ambulatory Visit (INDEPENDENT_AMBULATORY_CARE_PROVIDER_SITE_OTHER): Payer: No Typology Code available for payment source | Admitting: Adult Health

## 2022-10-04 VITALS — BP 133/81 | HR 83 | Ht 63.0 in | Wt 192.0 lb

## 2022-10-04 DIAGNOSIS — F4312 Post-traumatic stress disorder, chronic: Secondary | ICD-10-CM

## 2022-10-04 DIAGNOSIS — F4010 Social phobia, unspecified: Secondary | ICD-10-CM

## 2022-10-04 DIAGNOSIS — F411 Generalized anxiety disorder: Secondary | ICD-10-CM | POA: Diagnosis not present

## 2022-10-04 DIAGNOSIS — F332 Major depressive disorder, recurrent severe without psychotic features: Secondary | ICD-10-CM

## 2022-10-04 MED ORDER — ALPRAZOLAM 0.5 MG PO TABS: 0.5000 mg | ORAL_TABLET | Freq: Three times a day (TID) | ORAL | 2 refills | Status: DC | PRN

## 2022-10-04 MED ORDER — ESCITALOPRAM OXALATE 20 MG PO TABS: 20.0000 mg | ORAL_TABLET | Freq: Every day | ORAL | 2 refills | Status: DC

## 2022-10-04 MED ORDER — ESZOPICLONE 2 MG PO TABS: 2.0000 mg | ORAL_TABLET | Freq: Every evening | ORAL | 2 refills | Status: DC | PRN

## 2022-10-04 NOTE — Progress Notes (Signed)
[facility] MD/PA/NP Initial Note  10/04/2022 10:33 AM Sylvia [name]  MRN:  [id number]  Chief Complaint:   HPI:  Patient seen today for initial psychiatric evaluation for MDD, GAD, PTSD and social phobia.  Previously seen by Dr. Creig Hines.   Describes mood today as "not so good".Pleasant. Tearful at times. Mood symptoms - reports depression, anxiety and irritability. Reports panic attacks - started 2 months ago at work. Reports over thinking - constantly - "looking for a solution to fix things". Reports mood fluctuations - "having highs and lows". Stating "I feel like I'm struggling". Has taken medications previously and felt like they were helpful - would  like to restart them. Reports struggling in the workplace. Feeling overwhelmed with external situational stressors. Feels like she is struggling personally and professionally. Trying to incorporate her therapeutic tools, but they aren't working as well as they once did. Plans to restart therapy. Varying interest and motivation. Taking medications as prescribed.  Energy levels stable - "on auto pilot". Active, does not have a regular exercise routine.    Enjoys some usual interests and activities. Married, but separated. Has 4 children - 2 with husband. Has custody of 33 year old grand-daughter. Spending time with family. Appetite adequate - not eating a lot. Weight gain - 193 pounds. Sleeps well most nights. Averages 5 to 6 hours on days she works and 4 to 6 hours when she is off. Focus and concentration stable. Completing tasks. Managing aspects of household. Working full time Chiropodist at [employer]. Denies SI or HI.  Denies AH or VH. Denies self harm.  Denies substance use. Planning to restart therapy with Lina Sayre.  Previous medication trials:  Ambien, Xanax, Zoloft-tremors  Visit Diagnosis:    ICD-10-CM   1. Severe recurrent major depression without psychotic features ([place])  F33.2     2. GAD (generalized anxiety  disorder)  F41.1     3. Prolonged posttraumatic stress disorder  F43.12     4. Social phobia  F40.10       Past Psychiatric History: Previously followed by Dr. Creig Hines. Denies previous hospitalizations.  Past Medical History:  Past Medical History:  Diagnosis Date   Allergy    Anxiety    Common migraine with intractable migraine 08/25/2016   Migraine     Past Surgical History:  Procedure Laterality Date   LAPAROSCOPIC HYSTERECTOMY  2011   WISDOM TOOTH EXTRACTION Bilateral 2014    Family Psychiatric History: Family history of mental illness.   Family History:  Family History  Problem Relation Age of Onset   Thyroid disease Mother    Heart attack Father        33   Heart disease Father    COPD Father    Hypertension Father    Cancer Paternal Grandfather     Social History:  Social History   Socioeconomic History   Marital status: Married    Spouse name: Not on file   Number of children: 3   Years of education: College   Highest education level: Not on file  Occupational History   Occupation: [place]: on long term disability form [employer]    Employer: [employer]  Tobacco Use   Smoking status: Former    Types: Cigarettes    Start date: 08/25/1994   Smokeless tobacco: Never  Vaping Use   Vaping Use: Never used  Substance and Sexual Activity   Alcohol use: No    Comment: Quit 12/2015   Drug  use: No   Sexual activity: Yes    Partners: Male    Comment: Married  Other Topics Concern   Not on file  Social History Narrative   Lives at home w/ husband and 3 children, also has 1 step-child   Right-handed   Caffeine: none since 05/2016   Social Determinants of Health   Financial Resource Strain: Low Risk  (04/24/2018)   Overall Financial Resource Strain (CARDIA)    Difficulty of Paying Living Expenses: Not hard at all  Food Insecurity: No Food Insecurity (04/24/2018)   Hunger Vital Sign    Worried About Running Out of Food in the Last Year: Never true     Ran Out of Food in the Last Year: Never true  Transportation Needs: No Transportation Needs (04/24/2018)   PRAPARE - Administrator, Civil Service (Medical): No    Lack of Transportation (Non-Medical): No  Physical Activity: Insufficiently Active (04/24/2018)   Exercise Vital Sign    Days of Exercise per Week: 3 days    Minutes of Exercise per Session: 30 min  Stress: Stress Concern Present (04/24/2018)   [employer] of Occupational Health - Occupational Stress Questionnaire    Feeling of Stress : To some extent  Social Connections: Not on file    Allergies:  Allergies  Allergen Reactions   Topamax [Topiramate]     Felt Suicidal   Latex Hives   Penicillins Hives    Metabolic Disorder Labs: No results found for: "HGBA1C", "MPG" No results found for: "PROLACTIN" No results found for: "CHOL", "TRIG", "HDL", "CHOLHDL", "VLDL", "LDLCALC" Lab Results  Component Value Date   TSH 1.13 01/31/2017    Therapeutic Level Labs: No results found for: "LITHIUM" No results found for: "VALPROATE" No results found for: "CBMZ"  Current Medications: Current Outpatient Medications  Medication Sig Dispense Refill   amLODipine (NORVASC) 2.5 MG tablet Take 1 tablet (2.5 mg total) by mouth at bedtime. 90 tablet 2   No current facility-administered medications for this visit.    Medication Side Effects: none  Orders placed this visit:  No orders of the defined types were placed in this encounter.   Psychiatric Specialty Exam:  Review of Systems  Musculoskeletal:  Negative for gait problem.  Neurological:  Negative for tremors.  Psychiatric/Behavioral:         Please refer to HPI    There were no vitals taken for this visit.There is no height or weight on file to calculate BMI.  General Appearance: Casual and Neat  Eye Contact:  Good  Speech:  Clear and Coherent and Normal Rate  Volume:  Normal  Mood:  Anxious and Depressed  Affect:  Appropriate and Congruent   Thought Process:  Coherent and Descriptions of Associations: Intact  Orientation:  Full (Time, Place, and Person)  Thought Content: Logical   Suicidal Thoughts:  No  Homicidal Thoughts:  No  Memory:  WNL  Judgement:  Good  Insight:  Good  Psychomotor Activity:  Normal  Concentration:  Concentration: Good and Attention Span: Good  Recall:  Good  Fund of Knowledge: Good  Language: Good  Assets:  Communication Skills Desire for Improvement Financial Resources/Insurance Housing Intimacy Leisure Time Physical Health Resilience Social Support Talents/Skills Transportation Vocational/Educational  ADL's:  Intact  Cognition: WNL  Prognosis:  Good   Screenings:  PHQ2-9    Flowsheet Row Office Visit from 04/24/2018 in [facility]  PHQ-2 Total Score 3  PHQ-9 Total Score 10  Flowsheet Row ED from [facility]/20[facility] in [facility]ERGENCY DEPART[facility]  C-SSRS RISK CATEGORY No Risk       Receiving Psychotherapy: No   Treatment Plan/Recommendations:   Plan:  PDMP reviewed  Add Lexapro 20mg  daily - take 1/2 daily x 7 days, then increase to one tablet. Add Xanax 0.5mg  TID  Add Lunesta 2mg  at hs  Complete FMLA - intermittent.  Time spent with patient was 60 minutes. Greater than 50% of face to face time with patient was spent on counseling and coordination of care.    RTC 4 weeks  Patient advised to contact office with any questions, adverse effects, or acute worsening in signs and symptoms.   Discussed potential benefits, risk, and side effects of benzodiazepines to include potential risk of tolerance and dependence, as well as possible drowsiness.  Advised patient not to drive if experiencing drowsiness and to take lowest possible effective dose to minimize risk of dependence and tolerance. a  , NP

## 2022-10-05 ENCOUNTER — Encounter: Payer: Self-pay | Admitting: Physician Assistant

## 2022-10-05 ENCOUNTER — Telehealth: Payer: Self-pay | Admitting: Adult Health

## 2022-10-05 NOTE — Telephone Encounter (Signed)
Received fax from [employer] re: [place]. Completion needed for Leave Form. Placed in Traci's box.

## 2022-10-11 ENCOUNTER — Ambulatory Visit (INDEPENDENT_AMBULATORY_CARE_PROVIDER_SITE_OTHER): Payer: No Typology Code available for payment source | Admitting: Family Medicine

## 2022-10-11 ENCOUNTER — Other Ambulatory Visit: Payer: Self-pay

## 2022-10-11 ENCOUNTER — Encounter: Payer: Self-pay | Admitting: Family Medicine

## 2022-10-11 VITALS — BP 100/80 | HR 81 | Temp 98.2°F | Ht 63.0 in | Wt 193.0 lb

## 2022-10-11 DIAGNOSIS — Z0001 Encounter for general adult medical examination with abnormal findings: Secondary | ICD-10-CM | POA: Diagnosis not present

## 2022-10-11 DIAGNOSIS — E6609 Other obesity due to excess calories: Secondary | ICD-10-CM

## 2022-10-11 DIAGNOSIS — Z0000 Encounter for general adult medical examination without abnormal findings: Secondary | ICD-10-CM

## 2022-10-11 DIAGNOSIS — Z1211 Encounter for screening for malignant neoplasm of colon: Secondary | ICD-10-CM

## 2022-10-11 DIAGNOSIS — Z6834 Body mass index (BMI) 34.0-34.9, adult: Secondary | ICD-10-CM | POA: Diagnosis not present

## 2022-10-11 MED ORDER — SEMAGLUTIDE-WEIGHT MANAGEMENT 0.25 MG/0.5ML ~~LOC~~ SOAJ: SUBCUTANEOUS | 2 refills | Status: DC

## 2022-10-11 NOTE — Progress Notes (Signed)
New Patient Office Visit  Subjective    Patient ID: Sylvia [name], female    DOB: 14-Aug-1976  Age: 46 y.o. MRN: [id number]  CC:  Chief Complaint  Patient presents with   Follow-up   Establish Care    re-establish care/ wants to discuss starting wt loss med    HPI Sylvia [name] presents to establish care. Oriented to practice routines and expectations. Concerns today include weight loss. She would like to try medical management as she has tried diet and exercise for >1 year. She reports eating a low calorie, heart healthy diet with smart options. She also exercises at the gym 4 days a week for 2 hours doing cardio and weights.  She sees an opthalmologist for her vision needs, an OB for her women's health needs, and psychiatry for her mental health who addresses her depression and anxiety. She reports these are all well-controlled and visits have been within the last year.   Outpatient Encounter Medications as of 10/11/2022  Medication Sig   ALPRAZolam (XANAX) 0.5 MG tablet Take 1 tablet (0.5 mg total) by mouth 3 (three) times daily as needed for anxiety.   escitalopram (LEXAPRO) 20 MG tablet Take 1 tablet (20 mg total) by mouth daily.   eszopiclone (LUNESTA) 2 MG TABS tablet Take 1 tablet (2 mg total) by mouth at bedtime as needed for sleep. Take immediately before bedtime   amLODipine (NORVASC) 2.5 MG tablet Take 1 tablet (2.5 mg total) by mouth at bedtime. (Patient not taking: Reported on 10/11/2022)   No facility-administered encounter medications on file as of 10/11/2022.    Past Medical History:  Diagnosis Date   Allergy    Anxiety    Common migraine with intractable migraine 08/25/2016   Migraine     Past Surgical History:  Procedure Laterality Date   LAPAROSCOPIC HYSTERECTOMY  2011   WISDOM TOOTH EXTRACTION Bilateral 2014    Family History  Problem Relation Age of Onset   Thyroid disease Mother    Heart attack Father        58   Heart  disease Father    COPD Father    Hypertension Father    Cancer Paternal Grandfather     Social History   Socioeconomic History   Marital status: Married    Spouse name: Not on file   Number of children: 3   Years of education: College   Highest education level: Not on file  Occupational History   Occupation: [employer]     Comment: on long term disability form [employer]    Employer: [employer]  Tobacco Use   Smoking status: Former    Types: Cigarettes    Start date: 08/25/1994   Smokeless tobacco: Never  Vaping Use   Vaping Use: Never used  Substance and Sexual Activity   Alcohol use: No    Comment: Quit 12/2015   Drug use: No   Sexual activity: Yes    Partners: Male    Comment: Married  Other Topics Concern   Not on file  Social History Narrative   Lives at home w/ husband and 3 children, also has 1 step-child   Right-handed   Caffeine: none since 05/2016   Social Determinants of Health   Financial Resource Strain: Low Risk  (04/24/2018)   Overall Financial Resource Strain (CARDIA)    Difficulty of Paying Living Expenses: Not hard at all  Food Insecurity: No Food Insecurity (04/24/2018)   Hunger Vital Sign    Worried About  Running Out of Food in the Last Year: Never true    [place] in the Last Year: Never true  Transportation Needs: No Transportation Needs (04/24/2018)   PRAPARE - Hydrologist (Medical): No    Lack of Transportation (Non-Medical): No  Physical Activity: Insufficiently Active (04/24/2018)   Exercise Vital Sign    Days of Exercise per Week: 3 days    Minutes of Exercise per Session: 30 min  Stress: Stress Concern Present (04/24/2018)   [place]    Feeling of Stress : To some extent  Social Connections: Not on file  Intimate Partner Violence: Not At Risk (04/24/2018)   Humiliation, Afraid, Rape, and Kick questionnaire    Fear of Current or Ex-Partner: No     Emotionally Abused: No    Physically Abused: No    Sexually Abused: No    Review of Systems  Constitutional: Negative.   HENT: Negative.    Eyes:        Floaters, wears contacts  Respiratory: Negative.    Cardiovascular: Negative.   Gastrointestinal: Negative.   Genitourinary: Negative.   Musculoskeletal: Negative.   Skin: Negative.   Neurological: Negative.   Endo/Heme/Allergies: Negative.   Psychiatric/Behavioral:  Positive for depression. The patient is nervous/anxious and has insomnia.         Objective    BP 100/80   Pulse 81   Temp 98.2 F (36.8 C) (Oral)   Ht 5\' 3"  (1.6 m)   Wt 193 lb (87.5 kg)   SpO2 97%   BMI 34.19 kg/m   Physical Exam Vitals and nursing note reviewed.  Constitutional:      Appearance: Normal appearance. She is normal weight.  HENT:     Head: Normocephalic and atraumatic.     Right Ear: Tympanic membrane, ear canal and external ear normal.     Left Ear: Tympanic membrane, ear canal and external ear normal.     Nose: Nose normal.     Mouth/Throat:     Mouth: Mucous membranes are moist.     Pharynx: Oropharynx is clear.  Eyes:     Extraocular Movements: Extraocular movements intact.     Conjunctiva/sclera: Conjunctivae normal.     Pupils: Pupils are equal, round, and reactive to light.  Cardiovascular:     Rate and Rhythm: Normal rate and regular rhythm.     Pulses: Normal pulses.     Heart sounds: Normal heart sounds.  Pulmonary:     Effort: Pulmonary effort is normal.     Breath sounds: Normal breath sounds.  Abdominal:     General: Bowel sounds are normal.     Palpations: Abdomen is soft.  Musculoskeletal:        General: Normal range of motion.     Cervical back: Normal range of motion and neck supple.  Skin:    General: Skin is warm and dry.     Capillary Refill: Capillary refill takes less than 2 seconds.  Neurological:     General: No focal deficit present.     Mental Status: She is alert and oriented to person,  place, and time. Mental status is at baseline.  Psychiatric:        Mood and Affect: Mood normal.        Behavior: Behavior normal.        Thought Content: Thought content normal.        Judgment: Judgment  normal.        Assessment & Plan:   1. Physical exam, annual Last physical exam >1 year ago. Routine labs done today. Health maintenance up to date.  - CBC with Differential/Platelet; Future - COMPLETE METABOLIC PANEL WITH GFR; Future - Lipid panel; Future - TSH; Future - Vitamin B12; Future - HIV Antibody (routine testing w rflx); Future - Hepatitis C Ab w/rfl RNA, PCR + Geno; Future  2. Class 1 obesity due to excess calories without serious comorbidity with body mass index (BMI) of 34.0 to 34.9 in adult Patient would like help losing weight. We discussed her current health concerns and she has been trying diet and exercise for 1 year without significant weight loss. Referral placed to Medical Weight Management. Will start [facility] 0.25mg  weekly and follow up in 1 month.  3. Body mass index 34.0-34.9, adult     Return in about 1 day (around 10/12/2022) for fasting labs.   Park Meo, FNP

## 2022-10-12 ENCOUNTER — Other Ambulatory Visit: Payer: No Typology Code available for payment source

## 2022-10-12 DIAGNOSIS — Z0000 Encounter for general adult medical examination without abnormal findings: Secondary | ICD-10-CM

## 2022-10-13 LAB — CBC WITH DIFFERENTIAL/PLATELET
Absolute Monocytes: 415 cells/uL (ref 200–950)
Basophils Absolute: 31 cells/uL (ref 0–200)
Basophils Relative: 0.5 %
Eosinophils Absolute: 61 cells/uL (ref 15–500)
Eosinophils Relative: 1 %
HCT: 43.2 % (ref 35.0–45.0)
Hemoglobin: 14.4 g/dL (ref 11.7–15.5)
Lymphs Abs: 1592 cells/uL (ref 850–3900)
MCH: 28.6 pg (ref 27.0–33.0)
MCHC: 33.3 g/dL (ref 32.0–36.0)
MCV: 85.7 fL (ref 80.0–100.0)
MPV: 11.5 fL (ref 7.5–12.5)
Monocytes Relative: 6.8 %
Neutro Abs: 4002 cells/uL (ref 1500–7800)
Neutrophils Relative %: 65.6 %
Platelets: 262 10*3/uL (ref 140–400)
RBC: 5.04 10*6/uL (ref 3.80–5.10)
RDW: 12 % (ref 11.0–15.0)
Total Lymphocyte: 26.1 %
WBC: 6.1 10*3/uL (ref 3.8–10.8)

## 2022-10-13 LAB — COMPLETE METABOLIC PANEL WITH GFR
AG Ratio: 1.5 (calc) (ref 1.0–2.5)
ALT: 7 U/L (ref 6–29)
AST: 13 U/L (ref 10–35)
Albumin: 4.4 g/dL (ref 3.6–5.1)
Alkaline phosphatase (APISO): 45 U/L (ref 31–125)
BUN: 10 mg/dL (ref 7–25)
CO2: 24 mmol/L (ref 20–32)
Calcium: 9.5 mg/dL (ref 8.6–10.2)
Chloride: 106 mmol/L (ref 98–110)
Creat: 0.84 mg/dL (ref 0.50–0.99)
Globulin: 2.9 g/dL (calc) (ref 1.9–3.7)
Glucose, Bld: 87 mg/dL (ref 65–99)
Potassium: 4.3 mmol/L (ref 3.5–5.3)
Sodium: 139 mmol/L (ref 135–146)
Total Bilirubin: 0.7 mg/dL (ref 0.2–1.2)
Total Protein: 7.3 g/dL (ref 6.1–8.1)
eGFR: 87 mL/min/{1.73_m2} (ref >=60)

## 2022-10-13 LAB — LIPID PANEL
Cholesterol: 145 mg/dL (ref <200)
HDL: 57 mg/dL (ref >=50)
LDL Cholesterol (Calc): 75 mg/dL (calc)
Non-HDL Cholesterol (Calc): 88 mg/dL (calc) (ref <130)
Total CHOL/HDL Ratio: 2.5 (calc) (ref <5.0)
Triglycerides: 53 mg/dL (ref <150)

## 2022-10-13 LAB — VITAMIN B12: Vitamin B-12: 345 pg/mL (ref 200–1100)

## 2022-10-13 LAB — HEPATITIS C AB W/RFL RNA, PCR + GENO: Hepatitis C Ab: NONREACTIVE

## 2022-10-13 LAB — HIV ANTIBODY (ROUTINE TESTING W REFLEX): HIV 1&2 Ab, 4th Generation: NONREACTIVE

## 2022-10-13 LAB — TSH: TSH: 2.35 mIU/L

## 2022-10-20 DIAGNOSIS — Z0289 Encounter for other administrative examinations: Secondary | ICD-10-CM

## 2022-10-20 NOTE — Telephone Encounter (Signed)
Noted will get Barnett Applebaum to sign today and will fax as requested. Thanks

## 2022-10-20 NOTE — Telephone Encounter (Signed)
Noted  

## 2022-10-20 NOTE — Telephone Encounter (Signed)
Pt called checking status on FMLA ppwk. Stated RM agreed to complete forms at new pt apt on 10/16. Stated RM was keeping hard file in her office. Pt received call from [employer] paperwork due 11/7. Advise pt update at [phone]

## 2022-10-21 ENCOUNTER — Ambulatory Visit (INDEPENDENT_AMBULATORY_CARE_PROVIDER_SITE_OTHER): Payer: No Typology Code available for payment source | Admitting: Family Medicine

## 2022-10-21 ENCOUNTER — Telehealth: Payer: Self-pay | Admitting: Family Medicine

## 2022-10-21 ENCOUNTER — Ambulatory Visit (INDEPENDENT_AMBULATORY_CARE_PROVIDER_SITE_OTHER): Payer: No Typology Code available for payment source

## 2022-10-21 VITALS — BP 122/73 | HR 69 | Ht 63.0 in | Wt 193.0 lb

## 2022-10-21 DIAGNOSIS — M25562 Pain in left knee: Secondary | ICD-10-CM | POA: Diagnosis not present

## 2022-10-21 DIAGNOSIS — G8929 Other chronic pain: Secondary | ICD-10-CM | POA: Diagnosis not present

## 2022-10-21 DIAGNOSIS — M25561 Pain in right knee: Secondary | ICD-10-CM

## 2022-10-21 NOTE — Telephone Encounter (Signed)
Patient called to follow up on messages sent via MyChart regarding refill of Wegovy; patient stated local pharmacies are out of stock of prescribed strength. Requesting a refill for a different strength. Please advise at [phone].

## 2022-10-21 NOTE — Assessment & Plan Note (Signed)
Patient reports that she has been having pain in both knees for at least a couple years.  She does indicate that right knee is worse than left knee.  Primary location of pain is over the lateral aspect of right knee, more so along proximal aspect of knee joint.  She feels that occasionally she will have a small protrusion or bump over lateral aspect of right knee that will pop up and she will need to reduce.  Pain can be worsened with walking, stairs.  Does not endorse any notable swelling, has had some instability at times.  She denies any specific inciting event when the pain started.  Denies any prior history of significant knee injuries.  Has not had prior evaluation, no prior imaging or formal physical therapy. Bilateral knee: No obvious deformity. No effusion.  Negative patellar grind.  Positive crepitus.  She does have some tenderness to palpation at distal aspect of IT band over lateral right knee Full ROM for flexion and extension.  Strength 5 out of 5 for flexion and extension. Anterior drawer: Negative Posterior drawer: Negative Lachman: Negative Varus stress test: Negative Valgus stress test: Negative McMurray's: Negative Neurovascularly intact.  No evidence of lymphatic disease.  Based on history and exam, feel that most likely cause of symptoms are related to patellofemoral pain syndrome and possible component of IT band syndrome.  Given duration of symptoms, feel would be reasonable to proceed with x-rays at this time, she will have those completed today. We recommend initial referral to physical therapy for further evaluation and management We will plan for follow-up in about 6 to 8 weeks to assess progress.  If not improving as expected, consider advanced imaging

## 2022-10-21 NOTE — Telephone Encounter (Signed)
Please advice?  Patient called to follow up on messages sent via MyChart regarding refill of Wegovy; patient stated local pharmacies are out of stock of prescribed strength. Requesting a refill for a different strength. Please advise at [phone].

## 2022-10-21 NOTE — Progress Notes (Signed)
    Procedures performed today:    None.  Independent interpretation of notes and tests performed by another provider:   None.  Brief History, Exam, Impression, and Recommendations:    BP 122/73   Pulse 69   Ht 5\' 3"  (1.6 m)   Wt 193 lb (87.5 kg)   SpO2 100%   BMI 34.19 kg/m   Chronic pain of both knees Patient reports that she has been having pain in both knees for at least a couple years.  She does indicate that right knee is worse than left knee.  Primary location of pain is over the lateral aspect of right knee, more so along proximal aspect of knee joint.  She feels that occasionally she will have a small protrusion or bump over lateral aspect of right knee that will pop up and she will need to reduce.  Pain can be worsened with walking, stairs.  Does not endorse any notable swelling, has had some instability at times.  She denies any specific inciting event when the pain started.  Denies any prior history of significant knee injuries.  Has not had prior evaluation, no prior imaging or formal physical therapy. Bilateral knee: No obvious deformity. No effusion.  Negative patellar grind.  Positive crepitus.  She does have some tenderness to palpation at distal aspect of IT band over lateral right knee Full ROM for flexion and extension.  Strength 5 out of 5 for flexion and extension. Anterior drawer: Negative Posterior drawer: Negative Lachman: Negative Varus stress test: Negative Valgus stress test: Negative McMurray's: Negative Neurovascularly intact.  No evidence of lymphatic disease.  Based on history and exam, feel that most likely cause of symptoms are related to patellofemoral pain syndrome and possible component of IT band syndrome.  Given duration of symptoms, feel would be reasonable to proceed with x-rays at this time, she will have those completed today. We recommend initial referral to physical therapy for further evaluation and management We will plan for follow-up  in about 6 to 8 weeks to assess progress.  If not improving as expected, consider advanced imaging  Return in about 6 weeks (around 12/02/2022).   ___________________________________________ Sylvia Yahr de [place], MD, ABFM, CAQSM [facility] and [place]

## 2022-10-21 NOTE — Patient Instructions (Signed)
  Medication Instructions:  Your physician recommends that you continue on your current medications as directed. Please refer to the Current Medication list given to you today. --If you need a refill on any your medications before your next appointment, please call your pharmacy first. If no refills are authorized on file call the office.-- Lab Work: Your physician has recommended that you have lab work today: No If you have labs (blood work) drawn today and your tests are completely normal, you will receive your results via MyChart message OR a phone call from our staff.  Please ensure you check your voicemail in the event that you authorized detailed messages to be left on a delegated number. If you have any lab test that is abnormal or we need to change your treatment, we will call you to review the results.  Referrals/Procedures/Imaging: X-Rays   Follow-Up: Your next appointment:   Your physician recommends that you schedule a follow-up appointment in: 6-8 weeks with Dr. de Cuba.  You will receive a text message or e-mail with a link to a survey about your care and experience with us today! We would greatly appreciate your feedback!   Thanks for letting us be apart of your health journey!!  Primary Care and Sports Medicine   Dr. Raymond de Cuba   We encourage you to activate your patient portal called "MyChart".  Sign up information is provided on this After Visit Summary.  MyChart is used to connect with patients for Virtual Visits (Telemedicine).  Patients are able to view lab/test results, encounter notes, upcoming appointments, etc.  Non-urgent messages can be sent to your provider as well. To learn more about what you can do with MyChart, please visit --  [email or web address].   

## 2022-10-22 NOTE — Telephone Encounter (Signed)
LVM for pt to call back regarding msg.

## 2022-10-27 ENCOUNTER — Telehealth: Payer: Self-pay

## 2022-10-27 NOTE — Telephone Encounter (Addendum)
Pt called Re: Wegovy?  Told pt that Reginal Lutes is on back order, the higher dose was not recommend per provider due to it will cause severe nausea. Per provide suggest pt go to [facility] and see if they can help her or giver her other options. Per pt agreed to go to a [employer] program.   Referral to [facility] was placed,10/11/22, per provider. Tried to call pt, LVM for pt  t

## 2022-10-27 NOTE — Telephone Encounter (Signed)
Pt return call, gave her [facility] phone  Referring To Provider Information [facility] [address] [place] [facility] [id number] [phone]    Referral Start Date: 10/11/2022 Referral End Date: 10/11/2023   Per pt will give them a call today.  Nothing further.

## 2022-10-28 NOTE — Telephone Encounter (Signed)
Please advice  

## 2022-10-31 NOTE — Progress Notes (Deleted)
Cardiology Office Note:    Date:  10/31/2022   ID:  Sylvia [name], DOB [birth year], MRN [id number]  PCP:  Park Meo, FNP   [facility] Providers Cardiologist:  None     Referring MD: No ref. provider found    History of Present Illness:    Sylvia [name] is a 46 y.o. female with a hx of x of anxiety and depression who presents to clinic for follow-up.   Patient initially seen in the ER on 03/01/22 for an episode of chest pain radiating to the left arm. Symptoms were worse with inspiration and were not exertional in nature. Trop negative x2. D-dimer negative. ECG with no ischemic changes, CXR unremarkable. We recommended outpatient follow-up.   In the interim, the patient underwent coronary CTA on 03/18/22 which showed no significant CAD. Ca score 0.   Was last seen in clinic on 04/2022 where she continued to have intermittent chest pain. We started low dose amlodipine in case there was an element of vasospasm. She was also referred to GI for further evaluation.  Today, ***  Past Medical History:  Diagnosis Date   Allergy    Anxiety    Common migraine with intractable migraine 08/25/2016   Migraine     Past Surgical History:  Procedure Laterality Date   LAPAROSCOPIC HYSTERECTOMY  2011   WISDOM TOOTH EXTRACTION Bilateral 2014    Current Medications: No outpatient medications have been marked as taking for the 11/02/22 encounter (Appointment) with Sylvia Sprague, MD.     Allergies:   Topamax [topiramate], Latex, and Penicillins   Social History   Socioeconomic History   Marital status: Married    Spouse name: Not on file   Number of children: 3   Years of education: College   Highest education level: Not on file  Occupational History   Occupation: [employer]     Comment: on long term disability form [employer]    Employer: [employer]  Tobacco Use   Smoking status: Former    Types: Cigarettes    Start date: 08/25/1994   Smokeless  tobacco: Never  Vaping Use   Vaping Use: Never used  Substance and Sexual Activity   Alcohol use: No    Comment: Quit 12/2015   Drug use: No   Sexual activity: Yes    Partners: Male    Comment: Married  Other Topics Concern   Not on file  Social History Narrative   Lives at home w/ husband and 3 children, also has 1 step-child   Right-handed   Caffeine: none since 05/2016   Social Determinants of Health   Financial Resource Strain: Low Risk  (04/24/2018)   Overall Financial Resource Strain (CARDIA)    Difficulty of Paying Living Expenses: Not hard at all  Food Insecurity: No Food Insecurity (04/24/2018)   Hunger Vital Sign    Worried About Running Out of Food in the Last Year: Never true    Ran Out of Food in the Last Year: Never true  Transportation Needs: No Transportation Needs (04/24/2018)   PRAPARE - Administrator, Civil Service (Medical): No    Lack of Transportation (Non-Medical): No  Physical Activity: Insufficiently Active (04/24/2018)   Exercise Vital Sign    Days of Exercise per Week: 3 days    Minutes of Exercise per Session: 30 min  Stress: Stress Concern Present (04/24/2018)   [employer] of Occupational Health - Occupational Stress Questionnaire    Feeling of Stress :  To some extent  Social Connections: Not on file     Family History: The patient's ***family history includes COPD in her father; Cancer in her paternal grandfather; Heart attack in her father; Heart disease in her father; Hypertension in her father; Thyroid disease in her mother.  ROS:   Please see the history of present illness.    *** All other systems reviewed and are negative.  EKGs/Labs/Other Studies Reviewed:    The following studies were reviewed today: Expand All Collapse All   Cardiology Office Note:     Date:  04/21/2022    ID:  Sylvia [name], DOB 17-Apr-1976, MRN [id number]   PCP:  No primary care provider on file.              [facility]  Providers Cardiologist:  None {     Referring MD: Salley Scarlet, MD      History of Present Illness:     Sylvia [name] is a 46 y.o. female with a hx of anxiety and depression who presents to clinic for follow-up.   Patient initially seen in the ER on 03/01/22 for an episode of chest pain radiating to the left arm. Symptoms were worse with inspiration and were not exertional in nature. Trop negative x2. D-dimer negative. ECG with no ischemic changes, CXR unremarkable. We recommended outpatient follow-up.   In the interim, the patient underwent coronary CTA on 03/18/22 which showed no significant CAD. Ca score 0.    Today, the patient states that she continues to suffer from chest pain/pressure that "comes and goes". She has felt the pain in her left chest/neck. She states she was able to feel her pulse in her neck. This is very concerning for her. She notices the chest pain at least 2 times a week. Last Tuesday her pain lasted for 1.5-2 hours. The other day her pain occurred while she was driving, but it can occur randomly at rest or with activity throughout the day. Previously before her visit to the ER she had associated lightheadedness. She was not lightheaded during her recent episodes.   Typically she watches her diet, so she knows her chest pain is not associated with her meals. Lying flat on the floor helps relieve her pain. Of note, she experienced some of the discomfort in clinic today. She states that pressing on the area "doesn't hurt but doesn't help either". Typically it begins in her chest and radiates to her neck, as she can "feel it rising."   Prior to her incident in March, she notes her blood pressure was frequently in the 90s. Afterwards she never sees readings below 120 systolic. Lately she is feeling different, such as more tenseness/pressure in her body which she relates to her higher blood pressures.   She endorses persistent fluid retention in her legs,  which is controlled with wearing compression. She denies noticing any worsening edema.   She also notes that she needs to prop her bed up while sleeping.   She denies any palpitations, or shortness of breath. No lightheadedness, headaches, syncope, orthopnea, or PND.   Of note, she believes she likely had COVID around February or March 2022. She lost her sense of taste, but her tests were always negative for COVID.       Past Medical History:  Diagnosis Date   Allergy     Anxiety     Common migraine with intractable migraine 08/25/2016   Migraine  Past Surgical History:  Procedure Laterality Date   LAPAROSCOPIC HYSTERECTOMY   2011   WISDOM TOOTH EXTRACTION Bilateral 2014      Current Medications: Active Medications      Current Meds  Medication Sig   amLODipine (NORVASC) 2.5 MG tablet Take 1 tablet (2.5 mg total) by mouth at bedtime.        Allergies:   Topamax [topiramate], Latex, and Penicillins    Social History         Socioeconomic History   Marital status: Married      Spouse name: Not on file   Number of children: 3   Years of education: College   Highest education level: Not on file  Occupational History   Occupation: [employer]       Comment: on long term disability form [employer]      Employer: [employer]  Tobacco Use   Smoking status: Former      Types: Cigarettes      Start date: 08/25/1994   Smokeless tobacco: Never  Vaping Use   Vaping Use: Never used  Substance and Sexual Activity   Alcohol use: No      Comment: Quit 12/2015   Drug use: No   Sexual activity: Yes      Partners: Male      Comment: Married  Other Topics Concern   Not on file  Social History Narrative    Lives at home w/ husband and 3 children, also has 1 step-child    Right-handed    Caffeine: none since 05/2016    Social Determinants of Health    Financial Resource Strain: Not on file  Food Insecurity: Not on file  Transportation Needs: Not on file  Physical Activity:  Not on file  Stress: Not on file  Social Connections: Not on file      Family History: The patient's family history includes COPD in her father; Cancer in her paternal grandfather; Heart attack in her father; Heart disease in her father; Hypertension in her father; Thyroid disease in her mother.   ROS:   Review of Systems  Constitutional:  Negative for chills and fever.  HENT:  Negative for sore throat and tinnitus.   Eyes:  Negative for blurred vision.  Respiratory:  Negative for hemoptysis and shortness of breath.   Cardiovascular:  Positive for chest pain and leg swelling. Negative for palpitations, orthopnea, claudication and PND.  Gastrointestinal:  Negative for blood in stool and diarrhea.  Genitourinary:  Negative for frequency.  Musculoskeletal:  Negative for falls.  Neurological:  Negative for tremors and seizures.  Endo/Heme/Allergies:  Negative for environmental allergies.  Psychiatric/Behavioral:  Negative for substance abuse. The patient is not nervous/anxious.       EKGs/Labs/Other Studies Reviewed:     The following studies were reviewed today:   Coronary CTA 03/18/22: FINDINGS: A 100 kV prospective scan was triggered in the descending thoracic aorta at 111 HU's. Axial non-contrast 3 mm slices were carried out through the heart. The data set was analyzed on a dedicated work station and scored using the Agatson method. Gantry rotation speed was 250 msecs and collimation was .6 mm. 0.8 mg of sl NTG was given. The 3D data set was reconstructed in 5% intervals of the 67-82 % of the R-R cycle. Diastolic phases were analyzed on a dedicated work station using MPR, MIP and VRT modes. The patient received 80 cc of contrast.   Image quality: Good. Misregistration artifact in distal LAD segment. Does  not affect read.   Aorta:  Normal size.  No calcifications.  No dissection.   Aortic Valve:  Trileaflet.  No calcifications.   Coronary Arteries:  Normal coronary  origin.  Right dominance.   RCA is a large dominant artery that gives rise to PDA and PLA. There is no plaque.   Left main is a large artery that gives rise to LAD and LCX arteries.   LAD is a large vessel that has no plaque.  2 diagonal branches.   LCX is a non-dominant artery.  There is no plaque.   Other findings:   Normal pulmonary vein drainage into the left atrium.   Normal left atrial appendage without a thrombus.   Normal size of the pulmonary artery.   Please see radiology report for non cardiac findings.   IMPRESSION: 1. Coronary calcium score of 0. This was 0 percentile for age and sex matched control.   2. Normal coronary origin with right dominance.   3. No evidence of CAD.   Echo stress test 2018 Study Conclusions   - Stress ECG conclusions: There were no stress arrhythmias or    conduction abnormalities. The stress ECG was normal.  - Staged echo: Left ventricular ejection fraction was normal at    rest and with stress. Normal echo stress  - Peak stress: The estimated LV ejection fraction was 70%.  - Target heart rate achieved. Appropriate blood pressure response    to exercise.   Impressions:   - Normal exercise stress echo study.     EKG:  EKG is *** ordered today.  The ekg ordered today demonstrates ***  Recent Labs: 10/12/2022: ALT 7; BUN 10; Creat 0.84; Hemoglobin 14.4; Platelets 262; Potassium 4.3; Sodium 139; TSH 2.35  Recent Lipid Panel    Component Value Date/Time   CHOL 145 10/12/2022 1013   TRIG 53 10/12/2022 1013   HDL 57 10/12/2022 1013   CHOLHDL 2.5 10/12/2022 1013   LDLCALC 75 10/12/2022 1013     Risk Assessment/Calculations:   {Does this patient have ATRIAL FIBRILLATION?:[id number]}  No BP recorded.  {Refresh Note OR Click here to enter BP  :1}***         Physical Exam:    VS:  There were no vitals taken for this visit.    Wt Readings from Last 3 Encounters:  10/21/22 193 lb (87.5 kg)  10/11/22 193 lb (87.5 kg)   04/21/22 182 lb 3.2 oz (82.6 kg)     GEN: *** Well nourished, well developed in no acute distress HEENT: Normal NECK: No JVD; No carotid bruits LYMPHATICS: No lymphadenopathy CARDIAC: ***RRR, no murmurs, rubs, gallops RESPIRATORY:  Clear to auscultation without rales, wheezing or rhonchi  ABDOMEN: Soft, non-tender, non-distended MUSCULOSKELETAL:  No edema; No deformity  SKIN: Warm and dry NEUROLOGIC:  Alert and oriented x 3 PSYCHIATRIC:  Normal affect   ASSESSMENT:    No diagnosis found. PLAN:    In order of problems listed above:  #Chest Pain: Patient with ER visit as described above for atypical chest pain with negative trops and d-dimer. Follow-up coronary CTA on 02/2022 showed no significant CAD, Ca score 0. Continues to have intermittent symptoms. Trialed on amlodipine with ***. Was referred to GI. -Continue amlodipine 2.5mg  daily -Continue primary prevention with lifestyle modifications         {Are you ordering a CV Procedure (e.g. stress test, cath, DCCV, TEE, etc)?   Press F2        :[id number]}  Medication Adjustments/Labs and Tests Ordered: Current medicines are reviewed at length with the patient today.  Concerns regarding medicines are outlined above.  No orders of the defined types were placed in this encounter.  No orders of the defined types were placed in this encounter.   There are no Patient Instructions on file for this visit.   Signed, Sylvia Sprague, MD  10/31/2022 8:12 PM    [facility]

## 2022-11-01 ENCOUNTER — Encounter: Payer: Self-pay | Admitting: Adult Health

## 2022-11-01 ENCOUNTER — Ambulatory Visit (INDEPENDENT_AMBULATORY_CARE_PROVIDER_SITE_OTHER): Payer: No Typology Code available for payment source | Admitting: Adult Health

## 2022-11-01 DIAGNOSIS — F411 Generalized anxiety disorder: Secondary | ICD-10-CM

## 2022-11-01 DIAGNOSIS — F332 Major depressive disorder, recurrent severe without psychotic features: Secondary | ICD-10-CM | POA: Diagnosis not present

## 2022-11-01 DIAGNOSIS — F4312 Post-traumatic stress disorder, chronic: Secondary | ICD-10-CM

## 2022-11-01 DIAGNOSIS — F4010 Social phobia, unspecified: Secondary | ICD-10-CM | POA: Diagnosis not present

## 2022-11-01 NOTE — Progress Notes (Signed)
Sylvia [name] [id number] 11/12/1976 46 y.o.  Subjective:   Patient ID:  Sylvia [name] is a 46 y.o. (DOB December 09, 1976) female.  Chief Complaint: No chief complaint on file.   HPI Sylvia [name] presents to the office today for follow-up of MDD, GAD, PTSD and social phobia.  Previously seen by Dr. Marlyne Beards.   Describes mood today as "not too good". Pleasant. Tearful at times. Mood symptoms - reports depression, anxiety and irritability - "it's about the same - seeing some better days". Reports decreased panic attacks - 2 to 3 since last visit. Reports worry and over thinking - daughter's situation. Reports some "fight or flight" feelings surrounding her daughter. Reports mood fluctuations - "having highs and lows". Stating "I'm not struggling as much - still about the same". Has restarted previous medications and feels like they are helpful. Plans to restart therapy. Varying interest and motivation. Taking medications as prescribed.  Energy levels variable - trying to self motivate. Active, does not have a regular exercise routine.    Enjoys some usual interests and activities. Married, but separated. Has 4 children - 2 with husband. Has custody of 42 year old grand-daughter. Spending time with family. Appetite varies. Weight loss - 8 pounds - 185 from 193 pounds. Sleeps well most nights. Averages 4 to 5 hours on days she works and 4 to 6 hours on days when she is off. Sleep has been more consistent since starting sleep aids. Focus and concentration difficulties. Completing tasks. Managing aspects of household. 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 Denies SI or HI.  Denies AH or VH. Denies self harm.  Denies substance use.  Planning to restart therapy with Stevphen Meuse.  Previous medication trials:  Ambien, Xanax, Zoloft-tremors    GAD-7    Flowsheet Row Office Visit from 10/21/2022 in [facility]  Office Visit from 10/11/2022 in [facility]  Total GAD-7 Score 0 13      PHQ2-9    Flowsheet Row Office Visit from 10/21/2022 in [facility] Office Visit from 10/11/2022 in [facility] Office Visit from 04/24/2018 in [facility]  PHQ-2 Total Score 0 2 3  PHQ-9 Total Score 0 11 10      Flowsheet Row ED from 03/01/2022 in [facility] EMERGENCY DEPARTMENT  C-SSRS RISK CATEGORY No Risk        Review of Systems:  Review of Systems  Musculoskeletal:  Negative for gait problem.  Neurological:  Negative for tremors.  Psychiatric/Behavioral:         Please refer to HPI    Medications: I have reviewed the patient's current medications.  Current Outpatient Medications  Medication Sig Dispense Refill   ALPRAZolam (XANAX) 0.5 MG tablet Take 1 tablet (0.5 mg total) by mouth 3 (three) times daily as needed for anxiety. 90 tablet 2   amLODipine (NORVASC) 2.5 MG tablet Take 1 tablet (2.5 mg total) by mouth at bedtime. 90 tablet 2   escitalopram (LEXAPRO) 20 MG tablet Take 1 tablet (20 mg total) by mouth daily. 30 tablet 2   eszopiclone (LUNESTA) 2 MG TABS tablet Take 1 tablet (2 mg total) by mouth at bedtime as needed for sleep. Take immediately before bedtime 30 tablet 2   No current facility-administered medications for this visit.    Medication Side Effects: None  Allergies:  Allergies  Allergen Reactions   Topamax [Topiramate]     Felt  Suicidal   Latex Hives   Penicillins Hives    Past Medical History:  Diagnosis Date   Allergy    Anxiety    Common migraine with intractable migraine 08/25/2016   Migraine     Past Medical History, Surgical history, Social history, and Family history were reviewed and updated as appropriate.   Please see review of systems for further details on the patient's review from today.   Objective:   Physical Exam:  There were no vitals taken for this  visit.  Physical Exam Constitutional:      General: She is not in acute distress. Musculoskeletal:        General: No deformity.  Neurological:     Mental Status: She is alert and oriented to person, place, and time.     Coordination: Coordination normal.  Psychiatric:        Attention and Perception: Attention and perception normal. She does not perceive auditory or visual hallucinations.        Mood and Affect: Mood normal. Mood is not anxious or depressed. Affect is not labile, blunt, angry or inappropriate.        Speech: Speech normal.        Behavior: Behavior normal.        Thought Content: Thought content normal. Thought content is not paranoid or delusional. Thought content does not include homicidal or suicidal ideation. Thought content does not include homicidal or suicidal plan.        Cognition and Memory: Cognition and memory normal.        Judgment: Judgment normal.     Comments: Insight intact     Lab Review:     Component Value Date/Time   NA 139 10/12/2022 1013   K 4.3 10/12/2022 1013   CL 106 10/12/2022 1013   CO2 24 10/12/2022 1013   GLUCOSE 87 10/12/2022 1013   BUN 10 10/12/2022 1013   CREATININE 0.84 10/12/2022 1013   CALCIUM 9.5 10/12/2022 1013   PROT 7.3 10/12/2022 1013   ALBUMIN 4.2 12/29/2016 0024   AST 13 10/12/2022 1013   ALT 7 10/12/2022 1013   ALKPHOS 43 12/29/2016 0024   BILITOT 0.7 10/12/2022 1013   GFRNONAA >60 03/01/2022 0506   GFRAA >60 12/29/2016 0024       Component Value Date/Time   WBC 6.1 10/12/2022 1013   RBC 5.04 10/12/2022 1013   HGB 14.4 10/12/2022 1013   HCT 43.2 10/12/2022 1013   PLT 262 10/12/2022 1013   MCV 85.7 10/12/2022 1013   MCH 28.6 10/12/2022 1013   MCHC 33.3 10/12/2022 1013   RDW 12.0 10/12/2022 1013   LYMPHSABS 1,592 10/12/2022 1013   MONOABS 0.7 03/01/2022 0506   EOSABS 61 10/12/2022 1013   BASOSABS 31 10/12/2022 1013    No results found for: "POCLITH", "LITHIUM"   No results found for:  "PHENYTOIN", "PHENOBARB", "VALPROATE", "CBMZ"   .res Assessment: Plan:    Plan:  PDMP reviewed  Lexapro 20mg  daily - take 1/2 daily x 7 days, then increase to one tablet - currently taking 1/2 tablet.. Xanax 0.5mg  TID  Lunesta 2mg  at hs  Complete FMLA - intermittent.  Time spent with patient was 60 minutes. Greater than 50% of face to face time with patient was spent on counseling and coordination of care.    RTC 4 weeks  Patient advised to contact office with any questions, adverse effects, or acute worsening in signs and symptoms.   Discussed potential benefits, risk, and side  effects of benzodiazepines to include potential risk of tolerance and dependence, as well as possible drowsiness.  Advised patient not to drive if experiencing drowsiness and to take lowest possible effective dose to minimize risk of dependence and tolerance.   Diagnoses and all orders for this visit:  Severe recurrent major depression without psychotic features (HCC)  GAD (generalized anxiety disorder)  Prolonged posttraumatic stress disorder  Social phobia     Please see After Visit Summary for patient specific instructions.  Future Appointments  Date Time Provider Department Center  11/02/2022  1:20 PM Meriam Sprague, MD [facility]  11/08/2022 10:30 AM Doree Albee, PA-C [facility]    No orders of the defined types were placed in this encounter.   -------------------------------

## 2022-11-02 ENCOUNTER — Ambulatory Visit: Payer: No Typology Code available for payment source | Admitting: Cardiology

## 2022-11-03 ENCOUNTER — Other Ambulatory Visit: Payer: Self-pay | Admitting: Family Medicine

## 2022-11-03 MED ORDER — WEGOVY 0.25 MG/0.5ML ~~LOC~~ SOAJ: SUBCUTANEOUS | 2 refills | Status: DC

## 2022-11-05 ENCOUNTER — Telehealth: Payer: Self-pay

## 2022-11-05 NOTE — Telephone Encounter (Signed)
PA-Wegovy sent to Plan

## 2022-11-05 NOTE — Progress Notes (Addendum)
11/08/2022 Sylvia [name] [id number] April 02, 1976  Referring provider: Freada Bergeron, MD Primary GI doctor: Dr. Lorenso Courier  ASSESSMENT AND PLAN:   Esophageal dysphagia Rare dysphagia, possible from reflux? Due to the chronic nature of patient's noncardiac chest pain over 2 years and the dysphagia patient would like to proceed with endoscopic evaluation. We will check labs for inflammation, H. pylori. EGD with dilatation to evaluate for stenosis, tumor, erosive/infectious esophagititis, and EOE.   If the EGD is negative and symptoms continue after PPI trial, can consider barium swallow and/or esophageal manometry. Will add on pepcid at night. I discussed risks of EGD with patient today, including risk of sedation, bleeding or perforation.  Patient provides understanding and gave verbal consent to proceed.  Non-cardiac chest pain Not associated with food, not associate with exertion.  Cardiac score 0. I like this could be more related to costochondritis some tenderness on palpation on left side of chest with exam. We will check vitamin D as a low vitamin D can precipitate this. Suggest trying Voltaren gel and lidocaine patches.  Screen for colon cancer Had colonoscopy 2016 which was unremarkable per patient, will request records from Dr. Amedeo Plenty. Has already submitted Cologuard with primary care, uncertain if patient needs this evaluation even with Cologuard, we will try to receive records from Dr. Amedeo Plenty, if Cologuard is positive would have to proceed with endoscopic evaluation patient  expresses understanding  Addendum shows 11/2015 colonoscopy with Dr. Amedeo Plenty due to family history of colon cancer in first-degree relative, patient does not have a first-degree relative with history of colon cancer. Entire examined colonoscopy was unremarkable recall 5 years for family history however this can be 10 since it is not a first-degree relative.    Patient Care Team: Rubie Maid, FNP as PCP - General (Family Medicine) Delight Hoh, MD (Inactive) as Consulting Physician (Psychiatry)  HISTORY OF PRESENT ILLNESS: 46 y.o. female with a past medical history of anxiety, depression, migraines, B 12 def and others listed below presents for evaluation of non cardiac chest pain.  02/2022 episode of chest pain rating left arm worse with inspiration not exertional in nature.  Negative troponin, negative D-dimer, EKG normal coronary CTA 03/18/2022 calcium score 0.  Started on amlodipine low-dose. 10/11/2022 established with primary care had normal B12, no anemia, normal kidney liver, negative HIV and hepatitis C Started on Wegovy, referred for colonoscopy.  She had colonoscopy 2016 with Dr. Amedeo Plenty at [place] for family history.  States the colonoscopy was normal at that time, pending cologuard testing.  Prostate in paternal GF and colon cancer maternal GF, no first degree relatives with colon cancer.  She has had chest tightness since having COVID shot 2 years ago.  Will happened 3-4 x a week, can last for hours or all day.  Not associated with food, no with exertion.  Tried amlodipine but did not help so stopped.  Will have intermittent left chest pressure/tightness with radiation into left upper neck.  Can be worse with deep breath in but not worse with movement.  No associated symptoms with it, can "feel pulse and see pulse in her neck".  No neck pain or left arm pain but was having some neuropathy in tips on her left fingers in the beginning.  She denies GERD.  Has rare dysphagia with pills only, no issues with meats, breads or liquids.  Denies nausea, vomiting.  She has BM daily, formed, not loose, rare hard stool.  Denies melena or hematochezia.  She is not on NSAIDS, has taken tylenol without help.    She  reports that she has quit smoking. Her smoking use included cigarettes. She started smoking about 28 years ago. She has never used smokeless tobacco. She  reports current alcohol use. She reports that she does not use drugs.  Current Medications:    Current Outpatient Medications (Cardiovascular):    amLODipine (NORVASC) 2.5 MG tablet, Take 1 tablet (2.5 mg total) by mouth at bedtime. (Patient not taking: Reported on 11/08/2022)     Current Outpatient Medications (Other):    ALPRAZolam (XANAX) 0.5 MG tablet, Take 1 tablet (0.5 mg total) by mouth 3 (three) times daily as needed for anxiety.   escitalopram (LEXAPRO) 20 MG tablet, Take 1 tablet (20 mg total) by mouth daily.   eszopiclone (LUNESTA) 2 MG TABS tablet, Take 1 tablet (2 mg total) by mouth at bedtime as needed for sleep. Take immediately before bedtime   famotidine (PEPCID) 40 MG tablet, Take 1 tablet (40 mg total) by mouth at bedtime.   Semaglutide-Weight Management (WEGOVY) 0.25 MG/0.5ML SOAJ, Inject 0.25 mg into the skin once a week for 28 days, THEN 0.5 mg once a week for 28 days. (Patient not taking: Reported on 11/08/2022)  Medical History:  Past Medical History:  Diagnosis Date   Allergy    Anxiety    B12 deficiency    Chronic headaches    Common migraine with intractable migraine 08/25/2016   Depression    GAD (generalized anxiety disorder)    Migraine    Obesity    Allergies:  Allergies  Allergen Reactions   Topamax [Topiramate]     Felt Suicidal   Latex Hives   Penicillins Hives     Surgical History:  She  has a past surgical history that includes Laparoscopic hysterectomy (2011) and Wisdom tooth extraction (Bilateral, 2014). Family History:  Her family history includes COPD in her father; Cancer in her paternal grandfather; Colon cancer in her maternal grandfather; Heart attack in her father; Heart disease in her father; Hypertension in her father; Thyroid disease in her mother.  REVIEW OF SYSTEMS  : All other systems reviewed and negative except where noted in the History of Present Illness.  PHYSICAL EXAM: BP 102/82   Pulse 74   Ht 5\' 3"  (1.6 m)    Wt 189 lb (85.7 kg)   BMI 33.48 kg/m  General:   Pleasant, well developed female in no acute distress Head:   Normocephalic and atraumatic. Eyes:  sclerae anicteric,conjunctive pink  Heart:   regular rate and rhythm Pulm:  Clear anteriorly; no wheezing,  Abdomen:   Soft, Flat AB, Active bowel sounds. No tenderness . Without guarding and Without rebound, No organomegaly appreciated. Rectal: Not evaluated Extremities:  Without edema. Msk: Symmetrical without gross deformities. Peripheral pulses intact.  Neurologic:  Alert and  oriented x4;  No focal deficits.  Skin:   Dry and intact without significant lesions or rashes. Psychiatric:  Cooperative. Normal mood and affect.    , PA-C 11:32 AM

## 2022-11-08 ENCOUNTER — Ambulatory Visit (INDEPENDENT_AMBULATORY_CARE_PROVIDER_SITE_OTHER): Payer: No Typology Code available for payment source | Admitting: Physician Assistant

## 2022-11-08 ENCOUNTER — Other Ambulatory Visit (INDEPENDENT_AMBULATORY_CARE_PROVIDER_SITE_OTHER): Payer: No Typology Code available for payment source

## 2022-11-08 ENCOUNTER — Encounter: Payer: Self-pay | Admitting: Physician Assistant

## 2022-11-08 VITALS — BP 102/82 | HR 74 | Ht 63.0 in | Wt 189.0 lb

## 2022-11-08 DIAGNOSIS — Z1211 Encounter for screening for malignant neoplasm of colon: Secondary | ICD-10-CM

## 2022-11-08 DIAGNOSIS — R0789 Other chest pain: Secondary | ICD-10-CM

## 2022-11-08 DIAGNOSIS — R1319 Other dysphagia: Secondary | ICD-10-CM

## 2022-11-08 LAB — COMPREHENSIVE METABOLIC PANEL
ALT: 9 U/L (ref 0–35)
AST: 14 U/L (ref 0–37)
Albumin: 4.6 g/dL (ref 3.5–5.2)
Alkaline Phosphatase: 47 U/L (ref 39–117)
BUN: 8 mg/dL (ref 6–23)
CO2: 25 mEq/L (ref 19–32)
Calcium: 9.4 mg/dL (ref 8.4–10.5)
Chloride: 105 mEq/L (ref 96–112)
Creatinine, Ser: 0.75 mg/dL (ref 0.40–1.20)
GFR: 96.22 mL/min (ref >60.00)
Glucose, Bld: 85 mg/dL (ref 70–99)
Potassium: 3.6 mEq/L (ref 3.5–5.1)
Sodium: 137 mEq/L (ref 135–145)
Total Bilirubin: 0.7 mg/dL (ref 0.2–1.2)
Total Protein: 7.9 g/dL (ref 6.0–8.3)

## 2022-11-08 LAB — CBC WITH DIFFERENTIAL/PLATELET
Basophils Absolute: 0 10*3/uL (ref 0.0–0.1)
Basophils Relative: 0.5 % (ref 0.0–3.0)
Eosinophils Absolute: 0.1 10*3/uL (ref 0.0–0.7)
Eosinophils Relative: 0.7 % (ref 0.0–5.0)
HCT: 44.7 % (ref 36.0–46.0)
Hemoglobin: 14.5 g/dL (ref 12.0–15.0)
Lymphocytes Relative: 22.1 % (ref 12.0–46.0)
Lymphs Abs: 1.9 10*3/uL (ref 0.7–4.0)
MCHC: 32.5 g/dL (ref 30.0–36.0)
MCV: 85.9 fl (ref 78.0–100.0)
Monocytes Absolute: 0.4 10*3/uL (ref 0.1–1.0)
Monocytes Relative: 4.9 % (ref 3.0–12.0)
Neutro Abs: 6.2 10*3/uL (ref 1.4–7.7)
Neutrophils Relative %: 71.8 % (ref 43.0–77.0)
Platelets: 231 10*3/uL (ref 150.0–400.0)
RBC: 5.2 Mil/uL — ABNORMAL HIGH (ref 3.87–5.11)
RDW: 12.8 % (ref 11.5–15.5)
WBC: 8.7 10*3/uL (ref 4.0–10.5)

## 2022-11-08 LAB — C-REACTIVE PROTEIN: CRP: <1 mg/dL (ref 0.5–20.0)

## 2022-11-08 LAB — SEDIMENTATION RATE: Sed Rate: 14 mm/hr (ref 0–20)

## 2022-11-08 MED ORDER — FAMOTIDINE 40 MG PO TABS: 40.0000 mg | ORAL_TABLET | Freq: Every day | ORAL | 1 refills | Status: AC

## 2022-11-08 NOTE — Patient Instructions (Addendum)
Your provider has requested that you go to the basement level for lab work before leaving today. Press "B" on the elevator. The lab is located at the first door on the left as you exit the elevator.   No aleve, ibuprofen, goody powders, as these are antiinflammatories and can cause inflammation in your stomach, increase bleeding risk and cause ulcers.  You can talk with PCP about alternative pain options.  Can do tyelnol max 3000 mg a day, salon pas patches are over the counter and voltern gel is topical antiinflammatory that is safe.   Dysphagia precautions:  1. Take reflux medications 30+ minutes before food in the morning- pepcid 40 mg  2. Begin meals with warm beverage 3. Eat smaller more frequent meals 4. Eat slowly, taking small bites and sips 5. Alternate solids and liquids 6. Avoid foods/liquids that increase acid production 7. Sit upright during and for 30+ minutes after meals to facilitate esophageal clearing 8. All meats should be chopped finely.   If something gets hung in your esophagus and will not come up or go down, proceed to the emergency room.    Costochondritis  Costochondritis is inflammation of the tissue (cartilage) that connects the ribs to the breastbone (sternum). This causes pain in the front of the chest. The pain usually starts slowly and involves more than one rib. What are the causes? The exact cause of this condition is not always known. It results from stress on the cartilage where your ribs attach to your sternum. The cause of this stress could be: Chest injury. Exercise or activity, such as lifting. Severe coughing. What increases the risk? You are more likely to develop this condition if you: Are female. Are 15-69 years old. Recently started a new exercise or work activity. Have low levels of vitamin D. Have a condition that makes you cough frequently. What are the signs or symptoms? The main symptom of this condition is chest pain. The  pain: Usually starts gradually and can be sharp or dull. Gets worse with deep breathing, coughing, or exercise. Gets better with rest. May be worse when you press on the affected area of your ribs and sternum. How is this diagnosed? This condition is diagnosed based on your symptoms, your medical history, and a physical exam. Your health care provider will check for pain when pressing on your sternum. You may also have tests to rule out other causes of chest pain. These may include: A chest X-ray to check for lung problems. An ECG (electrocardiogram) to see if you have a heart problem that could be causing the pain. An imaging scan to rule out a chest or rib fracture. How is this treated? This condition usually goes away on its own over time. Your health care provider may prescribe an NSAID, such as ibuprofen, to reduce pain and inflammation. Treatment may also include: Resting and avoiding activities that make pain worse. Applying heat or ice to the area to reduce pain and inflammation. Doing exercises to stretch your chest muscles. If these treatments do not help, your health care provider may inject a numbing medicine at the sternum-rib connection to help relieve the pain. Follow these instructions at home: Managing pain, stiffness, and swelling     If directed, put ice on the painful area. To do this: Put ice in a plastic bag. Place a towel between your skin and the bag. Leave the ice on for 20 minutes, 2-3 times a day. If directed, apply heat to the affected  area as often as told by your health care provider. Use the heat source that your health care provider recommends, such as a moist heat pack or a heating pad. Place a towel between your skin and the heat source. Leave the heat on for 20-30 minutes. Remove the heat if your skin turns bright red. This is especially important if you are unable to feel pain, heat, or cold. You may have a greater risk of getting  burned. Activity Rest as told by your health care provider. Avoid activities that make pain worse. This includes any activities that use chest, abdominal, and side muscles. Do not lift anything that is heavier than 10 lb (4.5 kg), or the limit that you are told, until your health care provider says that it is safe. Return to your normal activities as told by your health care provider. Ask your health care provider what activities are safe for you. General instructions Take over-the-counter and prescription medicines only as told by your health care provider. Keep all follow-up visits as told by your health care provider. This is important. Contact a health care provider if: You have chills or a fever. Your pain does not go away or it gets worse. You have a cough that does not go away. Get help right away if: You have shortness of breath. You have severe chest pain that is not relieved by medicines, heat, or ice. These symptoms may represent a serious problem that is an emergency. Do not wait to see if the symptoms will go away. Get medical help right away. Call your local emergency services (911 in the [place] Do not drive yourself to the hospital.  Summary Costochondritis is inflammation of the tissue (cartilage) that connects the ribs to the breastbone (sternum). This condition causes pain in the front of the chest. Costochondritis results from stress on the cartilage where your ribs attach to your sternum. Treatment may include medicines, rest, heat or ice, and exercises. This information is not intended to replace advice given to you by your health care provider. Make sure you discuss any questions you have with your health care provider. Document Revised: 02/23/2022 Document Reviewed: 10/19/2019 Elsevier Patient Education  2023 [employer].

## 2022-11-08 NOTE — Progress Notes (Signed)
I agree with the assessment and plan as outlined by Ms. Collier. 

## 2022-11-10 LAB — IGA: Immunoglobulin A: 214 mg/dL (ref 47–310)

## 2022-11-10 LAB — TISSUE TRANSGLUTAMINASE, IGA: (tTG) Ab, IgA: <1 U/mL

## 2022-11-15 NOTE — Telephone Encounter (Signed)
Sylvia [name] (Key: [id number]) Rx #: [id number] 0.25MG /0.5ML auto-injector  PA-Wegovy been approved.

## 2022-11-17 ENCOUNTER — Ambulatory Visit (HOSPITAL_BASED_OUTPATIENT_CLINIC_OR_DEPARTMENT_OTHER): Payer: No Typology Code available for payment source | Admitting: Family Medicine

## 2022-11-19 NOTE — Progress Notes (Signed)
18 pages of records from [facility] have been received and placed on [employer] for review.

## 2022-11-23 ENCOUNTER — Ambulatory Visit (HOSPITAL_BASED_OUTPATIENT_CLINIC_OR_DEPARTMENT_OTHER): Payer: No Typology Code available for payment source | Admitting: Physical Therapy

## 2022-11-29 ENCOUNTER — Ambulatory Visit (INDEPENDENT_AMBULATORY_CARE_PROVIDER_SITE_OTHER): Payer: No Typology Code available for payment source | Admitting: Adult Health

## 2022-11-29 ENCOUNTER — Encounter: Payer: Self-pay | Admitting: Adult Health

## 2022-11-29 DIAGNOSIS — F332 Major depressive disorder, recurrent severe without psychotic features: Secondary | ICD-10-CM

## 2022-11-29 DIAGNOSIS — F4312 Post-traumatic stress disorder, chronic: Secondary | ICD-10-CM | POA: Diagnosis not present

## 2022-11-29 DIAGNOSIS — F4010 Social phobia, unspecified: Secondary | ICD-10-CM | POA: Diagnosis not present

## 2022-11-29 DIAGNOSIS — F411 Generalized anxiety disorder: Secondary | ICD-10-CM

## 2022-11-29 MED ORDER — ESCITALOPRAM OXALATE 20 MG PO TABS: 20.0000 mg | ORAL_TABLET | Freq: Every day | ORAL | 2 refills | Status: DC

## 2022-11-29 MED ORDER — ESZOPICLONE 2 MG PO TABS: 2.0000 mg | ORAL_TABLET | Freq: Every evening | ORAL | 2 refills | Status: DC | PRN

## 2022-11-29 MED ORDER — ALPRAZOLAM 0.5 MG PO TABS: 0.5000 mg | ORAL_TABLET | Freq: Three times a day (TID) | ORAL | 2 refills | Status: DC | PRN

## 2022-11-29 NOTE — Progress Notes (Signed)
Sylvia [name] [id number] Nov 16, 1976 46 y.o.  Subjective:   Patient ID:  Sylvia [name] is a 46 y.o. (DOB 1976/03/07) female.  Chief Complaint: No chief complaint on file.   HPI Sylvia [name] presents to the office today for follow-up of  MDD, GAD, PTSD and social phobia.  Previously seen by Dr. Marlyne Beards.   Describes mood today as "not too good". Pleasant. Tearful at times. Mood symptoms - reports depression, anxiety and irritability - "it's better". Reports decreased panic attacks - 3 since last visit. Reports worry and over thinking - "mostly elated to her daughter". Reports mood fluctuations - "highs and lows". Stating "I'm just taking it day to day". Feels like medications are helpful. Varying interest and motivation. Taking medications as prescribed.  Energy levels variable. Active, does not have a regular exercise routine.    Enjoys some usual interests and activities. Married, but separated. Has 4 children - 2 with husband. Has custody of 9 year old grand-daughter. Spending time with family. Appetite varies. Weight stable. Sleeps well most nights. Averages 4 to 5 hours on days she works and 4 to 6 hours on days when she is off.  Focus and concentration "difficulties - thinking about other things". Completing tasks. Managing aspects of household. 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 Denies SI or HI.  Denies AH or VH. Denies self harm.  Denies substance use.  Planning to restart therapy with Stevphen Meuse.  Previous medication trials:  Ambien, Xanax, Zoloft-tremors   GAD-7    Flowsheet Row Office Visit from 10/21/2022 in [facility] Office Visit from 10/11/2022 in [facility]  Total GAD-7 Score 0 13      PHQ2-9    Flowsheet Row Office Visit from 10/21/2022 in [facility] Office Visit from 10/11/2022 in [facility] Office Visit from 04/24/2018 in [facility]  PHQ-2 Total Score 0 2 3  PHQ-9 Total Score 0 11 10      Flowsheet Row ED from 03/01/2022 in [facility] EMERGENCY DEPARTMENT  C-SSRS RISK CATEGORY No Risk        Review of Systems:  Review of Systems  Musculoskeletal:  Negative for gait problem.  Neurological:  Negative for tremors.  Psychiatric/Behavioral:         Please refer to HPI    Medications: I have reviewed the patient's current medications.  Current Outpatient Medications  Medication Sig Dispense Refill   ALPRAZolam (XANAX) 0.5 MG tablet Take 1 tablet (0.5 mg total) by mouth 3 (three) times daily as needed for anxiety. 90 tablet 2   amLODipine (NORVASC) 2.5 MG tablet Take 1 tablet (2.5 mg total) by mouth at bedtime. (Patient not taking: Reported on 11/08/2022) 90 tablet 2   escitalopram (LEXAPRO) 20 MG tablet Take 1 tablet (20 mg total) by mouth daily. 30 tablet 2   eszopiclone (LUNESTA) 2 MG TABS tablet Take 1 tablet (2 mg total) by mouth at bedtime as needed for sleep. Take immediately before bedtime 30 tablet 2   famotidine (PEPCID) 40 MG tablet Take 1 tablet (40 mg total) by mouth at bedtime. 30 tablet 1   Semaglutide-Weight Management (WEGOVY) 0.25 MG/0.5ML SOAJ Inject 0.25 mg into the skin once a week for 28 days, THEN 0.5 mg once a week for 28 days. (Patient not taking: Reported on 11/08/2022) 2 mL 2   No current facility-administered medications for this visit.  Medication Side Effects: None  Allergies:  Allergies  Allergen Reactions   Topamax [Topiramate]     Felt Suicidal   Latex Hives   Penicillins Hives    Past Medical History:  Diagnosis Date   Allergy    Anxiety    B12 deficiency    Chronic headaches    Common migraine with intractable migraine 08/25/2016   Depression    GAD (generalized anxiety disorder)    Migraine    Obesity     Past Medical History, Surgical history, Social history, and Family history were  reviewed and updated as appropriate.   Please see review of systems for further details on the patient's review from today.   Objective:   Physical Exam:  There were no vitals taken for this visit.  Physical Exam Constitutional:      General: She is not in acute distress. Musculoskeletal:        General: No deformity.  Neurological:     Mental Status: She is alert and oriented to person, place, and time.     Coordination: Coordination normal.  Psychiatric:        Attention and Perception: Attention and perception normal. She does not perceive auditory or visual hallucinations.        Mood and Affect: Mood normal. Mood is not anxious or depressed. Affect is not labile, blunt, angry or inappropriate.        Speech: Speech normal.        Behavior: Behavior normal.        Thought Content: Thought content normal. Thought content is not paranoid or delusional. Thought content does not include homicidal or suicidal ideation. Thought content does not include homicidal or suicidal plan.        Cognition and Memory: Cognition and memory normal.        Judgment: Judgment normal.     Comments: Insight intact     Lab Review:     Component Value Date/Time   NA 137 11/08/2022 1133   K 3.6 11/08/2022 1133   CL 105 11/08/2022 1133   CO2 25 11/08/2022 1133   GLUCOSE 85 11/08/2022 1133   BUN 8 11/08/2022 1133   CREATININE 0.75 11/08/2022 1133   CREATININE 0.84 10/12/2022 1013   CALCIUM 9.4 11/08/2022 1133   PROT 7.9 11/08/2022 1133   ALBUMIN 4.6 11/08/2022 1133   AST 14 11/08/2022 1133   ALT 9 11/08/2022 1133   ALKPHOS 47 11/08/2022 1133   BILITOT 0.7 11/08/2022 1133   GFRNONAA >60 03/01/2022 0506   GFRAA >60 12/29/2016 0024       Component Value Date/Time   WBC 8.7 11/08/2022 1133   RBC 5.20 (H) 11/08/2022 1133   HGB 14.5 11/08/2022 1133   HCT 44.7 11/08/2022 1133   PLT 231.0 11/08/2022 1133   MCV 85.9 11/08/2022 1133   MCH 28.6 10/12/2022 1013   MCHC 32.5 11/08/2022 1133    RDW 12.8 11/08/2022 1133   LYMPHSABS 1.9 11/08/2022 1133   MONOABS 0.4 11/08/2022 1133   EOSABS 0.1 11/08/2022 1133   BASOSABS 0.0 11/08/2022 1133    No results found for: "POCLITH", "LITHIUM"   No results found for: "PHENYTOIN", "PHENOBARB", "VALPROATE", "CBMZ"   .res Assessment: Plan:    Plan:  PDMP reviewed  Lexapro 20mg  daily  Xanax 0.5mg  TID  Lunesta 2mg  at hs  Complete FMLA - intermittent.  Time spent with patient was 60 minutes. Greater than 50% of face to face time with patient was spent on counseling  and coordination of care.    RTC 6 weeks  Patient advised to contact office with any questions, adverse effects, or acute worsening in signs and symptoms.   Discussed potential benefits, risk, and side effects of benzodiazepines to include potential risk of tolerance and dependence, as well as possible drowsiness.  Advised patient not to drive if experiencing drowsiness and to take lowest possible effective dose to minimize risk of dependence and tolerance.   Diagnoses and all orders for this visit:  Severe recurrent major depression without psychotic features ([place])  GAD (generalized anxiety disorder)  Prolonged posttraumatic stress disorder  Social phobia     Please see After Visit Summary for patient specific instructions.  Future Appointments  Date Time Provider [place]  12/23/2022 10:00 AM Sharyn Creamer, MD [facility]  12/29/2022  9:00 AM Freada Bergeron, MD [facility]    No orders of the defined types were placed in this encounter.   -------------------------------

## 2022-12-23 ENCOUNTER — Ambulatory Visit (AMBULATORY_SURGERY_CENTER): Payer: Self-pay | Admitting: Internal Medicine

## 2022-12-23 ENCOUNTER — Encounter: Payer: Self-pay | Admitting: Internal Medicine

## 2022-12-23 VITALS — BP 110/65 | HR 71 | Temp 97.5°F | Resp 13 | Ht 63.0 in | Wt 189.0 lb

## 2022-12-23 DIAGNOSIS — K2970 Gastritis, unspecified, without bleeding: Secondary | ICD-10-CM | POA: Diagnosis not present

## 2022-12-23 DIAGNOSIS — F411 Generalized anxiety disorder: Secondary | ICD-10-CM | POA: Diagnosis not present

## 2022-12-23 DIAGNOSIS — R1319 Other dysphagia: Secondary | ICD-10-CM

## 2022-12-23 DIAGNOSIS — E669 Obesity, unspecified: Secondary | ICD-10-CM | POA: Diagnosis not present

## 2022-12-23 DIAGNOSIS — R1314 Dysphagia, pharyngoesophageal phase: Secondary | ICD-10-CM | POA: Diagnosis not present

## 2022-12-23 DIAGNOSIS — F32A Depression, unspecified: Secondary | ICD-10-CM | POA: Diagnosis not present

## 2022-12-23 DIAGNOSIS — K222 Esophageal obstruction: Secondary | ICD-10-CM

## 2022-12-23 DIAGNOSIS — K259 Gastric ulcer, unspecified as acute or chronic, without hemorrhage or perforation: Secondary | ICD-10-CM | POA: Diagnosis not present

## 2022-12-23 DIAGNOSIS — R1310 Dysphagia, unspecified: Secondary | ICD-10-CM

## 2022-12-23 MED ORDER — OMEPRAZOLE 40 MG PO CPDR: 40.0000 mg | DELAYED_RELEASE_CAPSULE | Freq: Two times a day (BID) | ORAL | 3 refills | Status: AC

## 2022-12-23 MED ORDER — SODIUM CHLORIDE 0.9 % IV SOLN: 500.0000 mL | Freq: Once | INTRAVENOUS | Status: DC

## 2022-12-23 NOTE — Progress Notes (Signed)
Report to pacu rn. Vss. Care resumed by rn. 

## 2022-12-23 NOTE — Patient Instructions (Addendum)
-   Discharge patient to home (with escort). - Use Prilosec (omeprazole) 40 mg PO BID for 8 weeks. - Await pathology results. - The findings and recommendations were discussed with the patient. - Return to GI clinic in 6 weeks. Call for appointment.  Handouts on Gastritis given.  YOU HAD AN ENDOSCOPIC PROCEDURE TODAY AT [facility]:   Refer to the procedure report that was given to you for any specific questions about what was found during the examination.  If the procedure report does not answer your questions, please call your gastroenterologist to clarify.  If you requested that your care partner not be given the details of your procedure findings, then the procedure report has been included in a sealed envelope for you to review at your convenience later.  YOU SHOULD EXPECT: Some feelings of bloating in the abdomen. Passage of more gas than usual.  Walking can help get rid of the air that was put into your GI tract during the procedure and reduce the bloating. If you had a lower endoscopy (such as a colonoscopy or flexible sigmoidoscopy) you may notice spotting of blood in your stool or on the toilet paper. If you underwent a bowel prep for your procedure, you may not have a normal bowel movement for a few days.  Please Note:  You might notice some irritation and congestion in your nose or some drainage.  This is from the oxygen used during your procedure.  There is no need for concern and it should clear up in a day or so.  SYMPTOMS TO REPORT IMMEDIATELY:  Following upper endoscopy (EGD)  Vomiting of blood or coffee ground material  New chest pain or pain under the shoulder blades  Painful or persistently difficult swallowing  New shortness of breath  Fever of 100F or higher  Black, tarry-looking stools  For urgent or emergent issues, a gastroenterologist can be reached at any hour by calling [phone]. Do not use MyChart messaging for urgent concerns.    DIET:   We do recommend a small meal at first, but then you may proceed to your regular diet.  Drink plenty of fluids but you should avoid alcoholic beverages for 24 hours.  ACTIVITY:  You should plan to take it easy for the rest of today and you should NOT DRIVE or use heavy machinery until tomorrow (because of the sedation medicines used during the test).    FOLLOW UP: Our staff will call the number listed on your records the next business day following your procedure.  We will call around 7:15- 8:00 am to check on you and address any questions or concerns that you may have regarding the information given to you following your procedure. If we do not reach you, we will leave a message.     If any biopsies were taken you will be contacted by phone or by letter within the next 1-3 weeks.  Please call us at [phone] if you have not heard about the biopsies in 3 weeks.    SIGNATURES/CONFIDENTIALITY: You and/or your care partner have signed paperwork which will be entered into your electronic medical record.  These signatures attest to the fact that that the information above on your After Visit Summary has been reviewed and is understood.  Full responsibility of the confidentiality of this discharge information lies with you and/or your care-partner.

## 2022-12-23 NOTE — Progress Notes (Signed)
GASTROENTEROLOGY PROCEDURE H&P NOTE   Primary Care Physician: Rubie Maid, FNP    Reason for Procedure:   Dysphagia, non-cardiac chest pain  Plan:    EGD  Patient is appropriate for endoscopic procedure(s) in the ambulatory ([place]) setting.  The nature of the procedure, as well as the risks, benefits, and alternatives were carefully and thoroughly reviewed with the patient. Ample time for discussion and questions allowed. The patient understood, was satisfied, and agreed to proceed.     HPI: Sylvia [name] is a 47 y.o. female who presents for EGD for evaluation of dysphagia, non-cardiac chest pain .  Patient was most recently seen in the Gastroenterology Clinic on 11/08/22.  No interval change in medical history since that appointment. Please refer to that note for full details regarding GI history and clinical presentation.   Past Medical History:  Diagnosis Date   Allergy    Anxiety    B12 deficiency    Chronic headaches    Common migraine with intractable migraine 08/25/2016   Depression    GAD (generalized anxiety disorder)    Migraine    Obesity     Past Surgical History:  Procedure Laterality Date   LAPAROSCOPIC HYSTERECTOMY  2011   WISDOM TOOTH EXTRACTION Bilateral 2014    Prior to Admission medications   Medication Sig Start Date End Date Taking? Authorizing Provider  ALPRAZolam Duanne Moron) 0.5 MG tablet Take 1 tablet (0.5 mg total) by mouth 3 (three) times daily as needed for anxiety. 11/29/22   Mozingo, Berdie Ogren, NP  amLODipine (NORVASC) 2.5 MG tablet Take 1 tablet (2.5 mg total) by mouth at bedtime. Patient not taking: Reported on 11/08/2022 04/21/22   Freada Bergeron, MD  escitalopram (LEXAPRO) 20 MG tablet Take 1 tablet (20 mg total) by mouth daily. 11/29/22   Mozingo, Berdie Ogren, NP  eszopiclone (LUNESTA) 2 MG TABS tablet Take 1 tablet (2 mg total) by mouth at bedtime as needed for sleep. Take immediately before bedtime 11/29/22    Mozingo, Berdie Ogren, NP  famotidine (PEPCID) 40 MG tablet Take 1 tablet (40 mg total) by mouth at bedtime. 11/08/22   Vladimir Crofts, PA-C  Semaglutide-Weight Management (WEGOVY) 0.25 MG/0.5ML SOAJ Inject 0.25 mg into the skin once a week for 28 days, THEN 0.5 mg once a week for 28 days. Patient not taking: Reported on 11/08/2022 11/03/22 12/29/22  Rubie Maid, FNP    Current Outpatient Medications  Medication Sig Dispense Refill   ALPRAZolam (XANAX) 0.5 MG tablet Take 1 tablet (0.5 mg total) by mouth 3 (three) times daily as needed for anxiety. 90 tablet 2   amLODipine (NORVASC) 2.5 MG tablet Take 1 tablet (2.5 mg total) by mouth at bedtime. (Patient not taking: Reported on 11/08/2022) 90 tablet 2   escitalopram (LEXAPRO) 20 MG tablet Take 1 tablet (20 mg total) by mouth daily. 30 tablet 2   eszopiclone (LUNESTA) 2 MG TABS tablet Take 1 tablet (2 mg total) by mouth at bedtime as needed for sleep. Take immediately before bedtime 30 tablet 2   famotidine (PEPCID) 40 MG tablet Take 1 tablet (40 mg total) by mouth at bedtime. 30 tablet 1   Semaglutide-Weight Management (WEGOVY) 0.25 MG/0.5ML SOAJ Inject 0.25 mg into the skin once a week for 28 days, THEN 0.5 mg once a week for 28 days. (Patient not taking: Reported on 11/08/2022) 2 mL 2   Current Facility-Administered Medications  Medication Dose Route Frequency Provider Last Rate Last Admin   0.9 %  sodium chloride infusion  500 mL Intravenous Once Sharyn Creamer, MD        Allergies as of 12/23/2022 - Review Complete 12/23/2022  Allergen Reaction Noted   Topamax [topiramate]  04/24/2018   Latex Hives 08/25/2016   Penicillins Hives 08/25/2016    Family History  Problem Relation Age of Onset   Thyroid disease Mother    Heart attack Father        56   Heart disease Father    COPD Father    Hypertension Father    Colon cancer Maternal Grandfather    Cancer Paternal Grandfather    Stomach cancer Neg Hx    Esophageal  cancer Neg Hx     Social History   Socioeconomic History   Marital status: Married    Spouse name: Not on file   Number of children: 3   Years of education: College   Highest education level: Not on file  Occupational History   Occupation: [place]: on long term disability form [employer]    Employer: [employer]   Occupation: nurse  Tobacco Use   Smoking status: Former    Types: Cigarettes    Start date: 08/25/1994   Smokeless tobacco: Never  Vaping Use   Vaping Use: Never used  Substance and Sexual Activity   Alcohol use: Yes    Comment: 1 a month   Drug use: No   Sexual activity: Yes    Partners: Male    Comment: Married  Other Topics Concern   Not on file  Social History Narrative   Lives at home w/ husband and 3 children, also has 1 step-child   Right-handed   Caffeine: none since 05/2016   Social Determinants of Health   Financial Resource Strain: Low Risk  (04/24/2018)   Overall Financial Resource Strain (CARDIA)    Difficulty of Paying Living Expenses: Not hard at all  Food Insecurity: No Food Insecurity (04/24/2018)   Hunger Vital Sign    Worried About Running Out of Food in the Last Year: Never true    [place] in the Last Year: Never true  Transportation Needs: No Transportation Needs (04/24/2018)   PRAPARE - Hydrologist (Medical): No    Lack of Transportation (Non-Medical): No  Physical Activity: Insufficiently Active (04/24/2018)   Exercise Vital Sign    Days of Exercise per Week: 3 days    Minutes of Exercise per Session: 30 min  Stress: Stress Concern Present (04/24/2018)   [place]    Feeling of Stress : To some extent  Social Connections: Not on file  Intimate Partner Violence: Not At Risk (04/24/2018)   Humiliation, Afraid, Rape, and Kick questionnaire    Fear of Current or Ex-Partner: No    Emotionally Abused: No    Physically Abused: No     Sexually Abused: No    Physical Exam: Vital signs in last 24 hours: BP 109/70   Pulse 68   Temp (!) 97.5 F (36.4 C)   Ht 5\' 3"  (1.6 m)   Wt 189 lb (85.7 kg)   SpO2 100%   BMI 33.48 kg/m  GEN: NAD EYE: Sclerae anicteric ENT: MMM CV: Non-tachycardic Pulm: No increased WOB GI: Soft NEURO:  Alert & Oriented   Christia Reading, MD [facility]   12/23/2022 9:52 AM

## 2022-12-23 NOTE — Op Note (Signed)
[facility] Patient Name: Sylvia [name] Procedure Date: 12/23/2022 10:27 AM MRN: [id number] Endoscopist: Georgian Co , , [id number] Age: 47 Referring MD:  Date of Birth: 01/31/1976 Gender: Female Account #: [id number] Procedure:                Upper GI endoscopy Indications:              Dysphagia, Chest pain (non cardiac) Medicines:                Monitored Anesthesia Care Procedure:                Pre-Anesthesia Assessment:                           - Prior to the procedure, a History and Physical                            was performed, and patient medications and                            allergies were reviewed. The patient's tolerance of                            previous anesthesia was also reviewed. The risks                            and benefits of the procedure and the sedation                            options and risks were discussed with the patient.                            All questions were answered, and informed consent                            was obtained. Prior Anticoagulants: The patient has                            taken no anticoagulant or antiplatelet agents. ASA                            Grade Assessment: II - A patient with mild systemic                            disease. After reviewing the risks and benefits,                            the patient was deemed in satisfactory condition to                            undergo the procedure.                           After obtaining informed consent, the endoscope was  passed under direct vision. Throughout the                            procedure, the patient's blood pressure, pulse, and                            oxygen saturations were monitored continuously. The                            GIF [id number] was introduced through the                            mouth, and advanced to the second part of duodenum.                            The  upper GI endoscopy was accomplished without                            difficulty. The patient tolerated the procedure                            well. Scope In: Scope Out: Findings:                 A few benign-appearing, intrinsic mild                            (non-circumferential scarring) stenoses were found                            in the distal esophagus. The narrowest stenosis                            measured less than one cm (in length). The stenoses                            were traversed. A guidewire was placed and the                            scope was withdrawn. Dilation was performed with a                            Savary dilator with no resistance at 19 mm. The                            dilation site was examined following endoscope                            reinsertion and showed no change.                           Biopsies were taken with a cold forceps in the  proximal esophagus and in the distal esophagus for                            histology.                           Localized inflammation characterized by congestion                            (edema), erosions and erythema was found in the                            gastric body and in the gastric antrum. Biopsies                            were taken with a cold forceps for histology.                           The examined duodenum was normal. Complications:            No immediate complications. Estimated Blood Loss:     Estimated blood loss was minimal. Impression:               - Benign-appearing esophageal stenoses. Dilated.                           - Gastritis. Biopsied.                           - Normal examined duodenum.                           - Biopsies were taken with a cold forceps for                            histology in the proximal esophagus and in the                            distal esophagus. Recommendation:           - Discharge patient to home  (with escort).                           - Use Prilosec (omeprazole) 40 mg PO BID for 8                            weeks.                           - Await pathology results.                           - The findings and recommendations were discussed                            with the patient.                           -  Return to GI clinic in 6 weeks. Dr Georgian Co "[place]" [place],  12/23/2022 10:48:25 AM

## 2022-12-23 NOTE — Progress Notes (Signed)
Called to room to assist during endoscopic procedure.  Patient ID and intended procedure confirmed with present staff. Received instructions for my participation in the procedure from the performing physician.  

## 2022-12-24 ENCOUNTER — Telehealth: Payer: Self-pay

## 2022-12-24 NOTE — Telephone Encounter (Signed)
Left message on follow up call. 

## 2022-12-26 NOTE — Progress Notes (Unsigned)
Cardiology Office Note:    Date:  12/26/2022   ID:  Sylvia ShinerDemitra [name], DOB August 31, 1976, MRN [id number]  PCP:  Park MeoHoward, Amber S, FNP   [facility] Providers Cardiologist:  None { Click to update primary MD,subspecialty MD or APP then REFRESH:1}    Referring MD: Park MeoHoward, Amber S, FNP   No chief complaint on file. ***  History of Present Illness:    Sylvia [name] is a 47 y.o. female with a hx of anxiety and depression who presents to clinic for follow-up.   Patient initially seen in the ER on 03/01/22 for an episode of chest pain radiating to the left arm. Symptoms were worse with inspiration and were not exertional in nature. Trop negative x2. D-dimer negative. ECG with no ischemic changes, CXR unremarkable. We recommended outpatient follow-up.   In the interim, the patient underwent coronary CTA on 03/18/22 which showed no significant CAD. Ca score 0.    Today, the patient states that she continues to suffer from chest pain/pressure that "comes and goes". She has felt the pain in her left chest/neck. She states she was able to feel her pulse in her neck. This is very concerning for her. She notices the chest pain at least 2 times a week. Last Tuesday her pain lasted for 1.5-2 hours. The other day her pain occurred while she was driving, but it can occur randomly at rest or with activity throughout the day. Previously before her visit to the ER she had associated lightheadedness. She was not lightheaded during her recent episodes.   Typically she watches her diet, so she knows her chest pain is not associated with her meals. Lying flat on the floor helps relieve her pain. Of note, she experienced some of the discomfort in clinic today. She states that pressing on the area "doesn't hurt but doesn't help either". Typically it begins in her chest and radiates to her neck, as she can "feel it rising."   Prior to her incident in March, she notes her blood pressure was  frequently in the 90s. Afterwards she never sees readings below 120 systolic. Lately she is feeling different, such as more tenseness/pressure in her body which she relates to her higher blood pressures.   She endorses persistent fluid retention in her legs, which is controlled with wearing compression. She denies noticing any worsening edema.   She also notes that she needs to prop her bed up while sleeping.   She denies any palpitations, or shortness of breath. No lightheadedness, headaches, syncope, orthopnea, or PND.   Of note, she believes she likely had COVID around February or March 2022. She lost her sense of taste, but her tests were always negative for COVID.    Past Medical History:  Diagnosis Date   Allergy    Anxiety    B12 deficiency    Chronic headaches    Common migraine with intractable migraine 08/25/2016   Depression    GAD (generalized anxiety disorder)    Migraine    Obesity     Past Surgical History:  Procedure Laterality Date   LAPAROSCOPIC HYSTERECTOMY  2011   WISDOM TOOTH EXTRACTION Bilateral 2014    Current Medications: No outpatient medications have been marked as taking for the 12/29/22 encounter (Appointment) with Meriam SpraguePemberton, Kioni Stahl E, MD.     Allergies:   Topamax [topiramate], Latex, and Penicillins   Social History   Socioeconomic History   Marital status: Married    Spouse name: Not on file  Number of children: 3   Years of education: College   Highest education level: Not on file  Occupational History   Occupation: [employer]     Comment: on long term disability form [employer]    Employer: [employer]   Occupation: nurse  Tobacco Use   Smoking status: Former    Types: Cigarettes    Start date: 08/25/1994   Smokeless tobacco: Never  Vaping Use   Vaping Use: Never used  Substance and Sexual Activity   Alcohol use: Yes    Comment: 1 a month   Drug use: No   Sexual activity: Yes    Partners: Male    Comment: Married  Other Topics Concern    Not on file  Social History Narrative   Lives at home w/ husband and 3 children, also has 1 step-child   Right-handed   Caffeine: none since 05/2016   Social Determinants of Health   Financial Resource Strain: Low Risk  (04/24/2018)   Overall Financial Resource Strain (CARDIA)    Difficulty of Paying Living Expenses: Not hard at all  Food Insecurity: No Food Insecurity (04/24/2018)   Hunger Vital Sign    Worried About Running Out of Food in the Last Year: Never true    [place] in the Last Year: Never true  Transportation Needs: No Transportation Needs (04/24/2018)   PRAPARE - Hydrologist (Medical): No    Lack of Transportation (Non-Medical): No  Physical Activity: Insufficiently Active (04/24/2018)   Exercise Vital Sign    Days of Exercise per Week: 3 days    Minutes of Exercise per Session: 30 min  Stress: Stress Concern Present (04/24/2018)   [place]    Feeling of Stress : To some extent  Social Connections: Not on file     Family History: The patient's ***family history includes COPD in her father; Cancer in her paternal grandfather; Colon cancer in her maternal grandfather; Heart attack in her father; Heart disease in her father; Hypertension in her father; Thyroid disease in her mother. There is no history of Stomach cancer or Esophageal cancer.  ROS:   Please see the history of present illness.    *** All other systems reviewed and are negative.  EKGs/Labs/Other Studies Reviewed:    The following studies were reviewed today: Coronary CTA 03/18/22: FINDINGS: A 100 kV prospective scan was triggered in the descending thoracic aorta at 111 HU's. Axial non-contrast 3 mm slices were carried out through the heart. The data set was analyzed on a dedicated work station and scored using the [place]. Gantry rotation speed was 250 msecs and collimation was .6 mm. 0.8 mg of  sl NTG was given. The 3D data set was reconstructed in 5% intervals of the 67-82 % of the R-R cycle. Diastolic phases were analyzed on a dedicated work station using MPR, MIP and VRT modes. The patient received 80 cc of contrast.   Image quality: Good. Misregistration artifact in distal LAD segment. Does not affect read.   Aorta:  Normal size.  No calcifications.  No dissection.   Aortic Valve:  Trileaflet.  No calcifications.   Coronary Arteries:  Normal coronary origin.  Right dominance.   RCA is a large dominant artery that gives rise to PDA and PLA. There is no plaque.   Left main is a large artery that gives rise to LAD and LCX arteries.   LAD is a  large vessel that has no plaque.  2 diagonal branches.   LCX is a non-dominant artery.  There is no plaque.   Other findings:   Normal pulmonary vein drainage into the left atrium.   Normal left atrial appendage without a thrombus.   Normal size of the pulmonary artery.   Please see radiology report for non cardiac findings.   IMPRESSION: 1. Coronary calcium score of 0. This was 0 percentile for age and sex matched control.   2. Normal coronary origin with right dominance.   3. No evidence of CAD.   Echo stress test 2018 Study Conclusions   - Stress ECG conclusions: There were no stress arrhythmias or    conduction abnormalities. The stress ECG was normal.  - Staged echo: Left ventricular ejection fraction was normal at    rest and with stress. Normal echo stress  - Peak stress: The estimated LV ejection fraction was 70%.  - Target heart rate achieved. Appropriate blood pressure response    to exercise.   Impressions:   - Normal exercise stress echo study.   EKG:  EKG is *** ordered today.  The ekg ordered today demonstrates ***  Recent Labs: 10/12/2022: TSH 2.35 11/08/2022: ALT 9; BUN 8; Creatinine, Ser 0.75; Hemoglobin 14.5; Platelets 231.0; Potassium 3.6; Sodium 137  Recent Lipid Panel    Component  Value Date/Time   CHOL 145 10/12/2022 1013   TRIG 53 10/12/2022 1013   HDL 57 10/12/2022 1013   CHOLHDL 2.5 10/12/2022 1013   LDLCALC 75 10/12/2022 1013     Risk Assessment/Calculations:   {Does this patient have ATRIAL FIBRILLATION?[phone]}  No BP recorded.  {Refresh Note OR Click here to enter BP  :1}***         Physical Exam:    VS:  There were no vitals taken for this visit.    Wt Readings from Last 3 Encounters:  12/23/22 189 lb (85.7 kg)  11/08/22 189 lb (85.7 kg)  10/21/22 193 lb (87.5 kg)     GEN: *** Well nourished, well developed in no acute distress HEENT: Normal NECK: No JVD; No carotid bruits LYMPHATICS: No lymphadenopathy CARDIAC: ***RRR, no murmurs, rubs, gallops RESPIRATORY:  Clear to auscultation without rales, wheezing or rhonchi  ABDOMEN: Soft, non-tender, non-distended MUSCULOSKELETAL:  No edema; No deformity  SKIN: Warm and dry NEUROLOGIC:  Alert and oriented x 3 PSYCHIATRIC:  Normal affect   ASSESSMENT:    No diagnosis found. PLAN:    In order of problems listed above:  #Chest Pain: Patient with ER visit as described above for atypical chest pain with negative trops and d-dimer. Follow-up coronary CTA on 02/2022 showed no significant CAD, Ca score 0. Continues to have intermittent symptoms. Given response to nitro in the ER, concern for possible vasospasm. Will do trial of amlodipine to see if symptoms improve. Did not tolerate nito due to severe HA. -Continue amlodipine 2.5mg  daily for possible vasospasm -Can add Mg 200mg  at night -If no improvement from above, can consider GI referral -Continue primary prevention with lifestyle modifications         {Are you ordering a CV Procedure (e.g. stress test, cath, DCCV, TEE, etc)?   Press F2        :[id number]}    Medication Adjustments/Labs and Tests Ordered: Current medicines are reviewed at length with the patient today.  Concerns regarding medicines are outlined above.  No orders of  the defined types were placed in this encounter.  No orders of the  defined types were placed in this encounter.   There are no Patient Instructions on file for this visit.   Signed, Meriam Sprague, MD  12/26/2022 8:54 PM    [facility]

## 2022-12-27 ENCOUNTER — Other Ambulatory Visit: Payer: 59

## 2022-12-27 DIAGNOSIS — R0789 Other chest pain: Secondary | ICD-10-CM | POA: Diagnosis not present

## 2022-12-27 DIAGNOSIS — R1319 Other dysphagia: Secondary | ICD-10-CM | POA: Diagnosis not present

## 2022-12-28 LAB — HELICOBACTER PYLORI  SPECIAL ANTIGEN
MICRO NUMBER:: 14401887
SPECIMEN QUALITY: ADEQUATE

## 2022-12-29 ENCOUNTER — Ambulatory Visit: Payer: 59 | Attending: Cardiology | Admitting: Cardiology

## 2022-12-29 ENCOUNTER — Encounter: Payer: Self-pay | Admitting: Internal Medicine

## 2022-12-29 VITALS — BP 120/70 | HR 72 | Ht 63.0 in | Wt 194.6 lb

## 2022-12-29 DIAGNOSIS — R0789 Other chest pain: Secondary | ICD-10-CM

## 2022-12-29 DIAGNOSIS — K2950 Unspecified chronic gastritis without bleeding: Secondary | ICD-10-CM | POA: Diagnosis not present

## 2022-12-29 NOTE — Progress Notes (Signed)
Cardiology Office Note:    Date:  12/29/2022   ID:  Sylvia [name], DOB [birth year], MRN [id number]  PCP:  Rubie Maid, [place] Providers Cardiologist:  None   Referring MD: Rubie Maid, FNP   History of Present Illness:    Sylvia [name] is a 47 y.o. female with a hx of anxiety and depression who presents to clinic for follow-up.   Patient initially seen in the ER on 03/01/22 for an episode of chest pain radiating to the left arm. Symptoms were worse with inspiration and were not exertional in nature. Trop negative x2. D-dimer negative. ECG with no ischemic changes, CXR unremarkable. Follow-up coronary CTA on 03/18/22 which showed no significant CAD. Ca score 0.   Was last seen 04/2022 where she continued to have episodes of atypical chest pain. We referred to GI and she underwent EGD with dilation of esophageal narrowings. Was also noted to have gastritis. She was started on omeprazole 40mg  daily.  Today, she is overall feeling well. She continues to have chest pain but notes it is occasional and not as often as before. It has gotten better since her EGD and starting the prilosec.  She is compliant with amlodipine and notices that it has been helping. She wishes to continue at this time.  She reports that she has been going to the gym but has not been losing weight. She has not had any diet changes. She avoids drinking soda, but tends to drink smoothies. She is open to adjusting her diet and exercise routine.  She denies any palpitations, shortness of breath, or peripheral edema. No lightheadedness, headaches, syncope, orthopnea, or PND.   Past Medical History:  Diagnosis Date   Allergy    Anxiety    B12 deficiency    Chronic headaches    Common migraine with intractable migraine 08/25/2016   Depression    GAD (generalized anxiety disorder)    Migraine    Obesity     Past Surgical History:  Procedure Laterality Date    LAPAROSCOPIC HYSTERECTOMY  2011   WISDOM TOOTH EXTRACTION Bilateral 2014    Current Medications: No outpatient medications have been marked as taking for the 12/29/22 encounter (Office Visit) with Sylvia Bergeron, MD.     Allergies:   Topamax [topiramate], Latex, and Penicillins   Social History   Socioeconomic History   Marital status: Married    Spouse name: Not on file   Number of children: 3   Years of education: College   Highest education level: Not on file  Occupational History   Occupation: [place]: on long term disability form [employer]    Employer: [employer]   Occupation: nurse  Tobacco Use   Smoking status: Former    Types: Cigarettes    Start date: 08/25/1994   Smokeless tobacco: Never  Vaping Use   Vaping Use: Never used  Substance and Sexual Activity   Alcohol use: Yes    Comment: 1 a month   Drug use: No   Sexual activity: Yes    Partners: Male    Comment: Married  Other Topics Concern   Not on file  Social History Narrative   Lives at home w/ husband and 3 children, also has 1 step-child   Right-handed   Caffeine: none since 05/2016   Social Determinants of Health   Financial Resource Strain: Low Risk  (04/24/2018)   Overall Financial Resource Strain (CARDIA)  Difficulty of Paying Living Expenses: Not hard at all  Food Insecurity: No Food Insecurity (04/24/2018)   Hunger Vital Sign    Worried About Running Out of Food in the Last Year: Never true    Ran Out of Food in the Last Year: Never true  Transportation Needs: No Transportation Needs (04/24/2018)   PRAPARE - Administrator, Civil Service (Medical): No    Lack of Transportation (Non-Medical): No  Physical Activity: Insufficiently Active (04/24/2018)   Exercise Vital Sign    Days of Exercise per Week: 3 days    Minutes of Exercise per Session: 30 min  Stress: Stress Concern Present (04/24/2018)   [employer] of Occupational Health - Occupational Stress Questionnaire     Feeling of Stress : To some extent  Social Connections: Not on file     Family History: The patient's family history includes COPD in her father; Cancer in her paternal grandfather; Colon cancer in her maternal grandfather; Heart attack in her father; Heart disease in her father; Hypertension in her father; Thyroid disease in her mother. There is no history of Stomach cancer or Esophageal cancer.  ROS:   Please see the history of present illness.    Review of Systems  Constitutional:  Negative for chills and malaise/fatigue.  HENT:  Negative for congestion and ear pain.   Eyes:  Negative for double vision and pain.  Respiratory:  Negative for cough and sputum production.   Cardiovascular:  Positive for chest pain (ocassional (not as often as before)). Negative for palpitations, orthopnea, claudication, leg swelling and PND.  Gastrointestinal:  Negative for nausea and vomiting.  Genitourinary:  Negative for frequency and urgency.  Musculoskeletal:  Negative for joint pain and neck pain.  Neurological:  Negative for speech change and focal weakness.  Endo/Heme/Allergies:  Negative for environmental allergies.  Psychiatric/Behavioral:  Negative for hallucinations and suicidal ideas.     All other systems reviewed and are negative.  EKGs/Labs/Other Studies Reviewed:    The following studies were reviewed today: Coronary CTA 03/18/22: FINDINGS: A 100 kV prospective scan was triggered in the descending thoracic aorta at 111 HU's. Axial non-contrast 3 mm slices were carried out through the heart. The data set was analyzed on a dedicated work station and scored using the Agatson method. Gantry rotation speed was 250 msecs and collimation was .6 mm. 0.8 mg of sl NTG was given. The 3D data set was reconstructed in 5% intervals of the 67-82 % of the R-R cycle. Diastolic phases were analyzed on a dedicated work station using MPR, MIP and VRT modes. The patient received 80 cc of contrast.    Image quality: Good. Misregistration artifact in distal LAD segment. Does not affect read.   Aorta:  Normal size.  No calcifications.  No dissection.   Aortic Valve:  Trileaflet.  No calcifications.   Coronary Arteries:  Normal coronary origin.  Right dominance.   RCA is a large dominant artery that gives rise to PDA and PLA. There is no plaque.   Left main is a large artery that gives rise to LAD and LCX arteries.   LAD is a large vessel that has no plaque.  2 diagonal branches.   LCX is a non-dominant artery.  There is no plaque.   Other findings:   Normal pulmonary vein drainage into the left atrium.   Normal left atrial appendage without a thrombus.   Normal size of the pulmonary artery.   Please see radiology report for  non cardiac findings.   IMPRESSION: 1. Coronary calcium score of 0. This was 0 percentile for age and sex matched control.   2. Normal coronary origin with right dominance.   3. No evidence of CAD.   Echo stress test 2018 Study Conclusions   - Stress ECG conclusions: There were no stress arrhythmias or    conduction abnormalities. The stress ECG was normal.  - Staged echo: Left ventricular ejection fraction was normal at    rest and with stress. Normal echo stress  - Peak stress: The estimated LV ejection fraction was 70%.  - Target heart rate achieved. Appropriate blood pressure response    to exercise.   Impressions:   - Normal exercise stress echo study.   EKG:  EKG is personally reviewed.  12/29/2022: Sinus rhythm. Rate 72 bpm. T wave inversion in 3 and AVF 03/02/2022: Sinus rhythm Anteroseptal infarct, age indeterminate Minimal ST depression, inferior leads Since last tracing Non-specific ST-t changes   Recent Labs: 10/12/2022: TSH 2.35 11/08/2022: ALT 9; BUN 8; Creatinine, Ser 0.75; Hemoglobin 14.5; Platelets 231.0; Potassium 3.6; Sodium 137  Recent Lipid Panel    Component Value Date/Time   CHOL 145 10/12/2022 1013   TRIG 53  10/12/2022 1013   HDL 57 10/12/2022 1013   CHOLHDL 2.5 10/12/2022 1013   LDLCALC 75 10/12/2022 1013     Risk Assessment/Calculations:                Physical Exam:    VS:  BP 120/70   Pulse 72   Ht 5\' 3"  (1.6 m)   Wt 194 lb 9.6 oz (88.3 kg)   SpO2 99%   BMI 34.47 kg/m     Wt Readings from Last 3 Encounters:  12/29/22 194 lb 9.6 oz (88.3 kg)  12/23/22 189 lb (85.7 kg)  11/08/22 189 lb (85.7 kg)     GEN: Well nourished, well developed in no acute distress HEENT: Normal NECK: No JVD; No carotid bruits CARDIAC:  RRR, no murmurs, rubs, gallops RESPIRATORY:  Clear to auscultation without rales, wheezing or rhonchi  ABDOMEN: Soft, non-tender, non-distended MUSCULOSKELETAL:  No edema; No deformity  SKIN: Warm and dry NEUROLOGIC:  Alert and oriented x 3 PSYCHIATRIC:  Normal affect   ASSESSMENT:    1. Atypical chest pain   2. Chronic gastritis without bleeding, unspecified gastritis type    PLAN:    In order of problems listed above:  #Chest Pain: #Gastritis: Chest pain likely GI related given gastritis and esophageal narrowings on EGD s/p dilation. Coronary CTA on 02/2022 showed no significant CAD, Ca score 0. States amlodipine has helped some and wished to continue at this time. Will continue with PPI as well. -Continue prilosec and famotidine -Continue amlodipine per patient preference -Follow-up with GI as scheduled  #Overweight: BMI 34. Discussed diet and lifestyle at length today as detailed below.  Exercise recommendations: Goal of exercising for at least 30 minutes a day, at least 5 times per week.  Please exercise to a moderate exertion.  This means that while exercising it is difficult to speak in full sentences, however you are not so short of breath that you feel you must stop, and not so comfortable that you can carry on a full conversation.  Exertion level should be approximately a 5/10, if 10 is the most exertion you can perform.  Diet  recommendations: Recommend a heart healthy diet such as the Mediterranean diet.  This diet consists of plant based foods, healthy fats, lean  meats, olive oil.  It suggests limiting the intake of simple carbohydrates such as white breads, pastries, and pastas.  It also limits the amount of red meat, wine, and dairy products such as cheese that one should consume on a daily basis.      Follow up in 6 months          Medication Adjustments/Labs and Tests Ordered: Current medicines are reviewed at length with the patient today.  Concerns regarding medicines are outlined above.  Orders Placed This Encounter  Procedures   EKG 12-Lead   No orders of the defined types were placed in this encounter.  Patient Instructions  Medication Instructions:   Your physician recommends that you continue on your current medications as directed. Please refer to the Current Medication list given to you today.  *If you need a refill on your cardiac medications before your next appointment, please call your pharmacy*    Follow-Up: At [facility], you and your health needs are our priority.  As part of our continuing mission to provide you with exceptional heart care, we have created designated Provider Care Teams.  These Care Teams include your primary Cardiologist (physician) and Advanced Practice Providers (APPs -  Physician Assistants and Nurse Practitioners) who all work together to provide you with the care you need, when you need it.  We recommend signing up for the patient portal called "MyChart".  Sign up information is provided on this After Visit Summary.  MyChart is used to connect with patients for Virtual Visits (Telemedicine).  Patients are able to view lab/test results, encounter notes, upcoming appointments, etc.  Non-urgent messages can be sent to your provider as well.   To learn more about what you can do with MyChart, go to [email or web address].    Your next appointment:   6  month(s)  The format for your next appointment:   In Person  Provider:   DR. Johney Frame   Important Information About Sugar          I,Rachel Rivera,acting as a scribe for Sylvia Bergeron, MD.,have documented all relevant documentation on the behalf of Sylvia Bergeron, MD,as directed by  Sylvia Bergeron, MD while in the presence of Sylvia Bergeron, MD.  I, Sylvia Bergeron, MD, have reviewed all documentation for this visit. The documentation on 12/29/22 for the exam, diagnosis, procedures, and orders are all accurate and complete.   Signed, Sylvia Bergeron, MD  12/29/2022 10:33 AM    [place]

## 2022-12-29 NOTE — Patient Instructions (Signed)
Medication Instructions:   Your physician recommends that you continue on your current medications as directed. Please refer to the Current Medication list given to you today.  *If you need a refill on your cardiac medications before your next appointment, please call your pharmacy*   Follow-Up: At [facility], you and your health needs are our priority.  As part of our continuing mission to provide you with exceptional heart care, we have created designated Provider Care Teams.  These Care Teams include your primary Cardiologist (physician) and Advanced Practice Providers (APPs -  Physician Assistants and Nurse Practitioners) who all work together to provide you with the care you need, when you need it.  We recommend signing up for the patient portal called "MyChart".  Sign up information is provided on this After Visit Summary.  MyChart is used to connect with patients for Virtual Visits (Telemedicine).  Patients are able to view lab/test results, encounter notes, upcoming appointments, etc.  Non-urgent messages can be sent to your provider as well.   To learn more about what you can do with MyChart, go to [email or web address].    Your next appointment:   6 month(s)  The format for your next appointment:   In Person  Provider:   DR. PEMBERTON  Important Information About Sugar       

## 2023-01-05 ENCOUNTER — Encounter (INDEPENDENT_AMBULATORY_CARE_PROVIDER_SITE_OTHER): Payer: Self-pay | Admitting: Internal Medicine

## 2023-01-05 ENCOUNTER — Ambulatory Visit (INDEPENDENT_AMBULATORY_CARE_PROVIDER_SITE_OTHER): Payer: 59 | Admitting: Internal Medicine

## 2023-01-05 VITALS — BP 115/83 | HR 77 | Temp 98.2°F | Ht 64.0 in | Wt 188.0 lb

## 2023-01-05 DIAGNOSIS — Z6832 Body mass index (BMI) 32.0-32.9, adult: Secondary | ICD-10-CM

## 2023-01-05 DIAGNOSIS — M25562 Pain in left knee: Secondary | ICD-10-CM

## 2023-01-05 DIAGNOSIS — M25561 Pain in right knee: Secondary | ICD-10-CM | POA: Diagnosis not present

## 2023-01-05 DIAGNOSIS — E669 Obesity, unspecified: Secondary | ICD-10-CM | POA: Diagnosis not present

## 2023-01-05 DIAGNOSIS — G8929 Other chronic pain: Secondary | ICD-10-CM | POA: Diagnosis not present

## 2023-01-05 DIAGNOSIS — Z0289 Encounter for other administrative examinations: Secondary | ICD-10-CM

## 2023-01-05 NOTE — Progress Notes (Signed)
Office: [phone]  /  Fax: [phone]   Initial Visit  Sylvia [name] was seen in clinic today to evaluate for obesity. She is interested in losing weight to improve overall health and reduce the risk of weight related complications. She presents today to review program treatment options, initial physical assessment, and evaluation.   She works as an Therapist, sports at [employer].  She has 4 children.  She is concerned about weight gain over the last few years.  She used to weigh 145 pounds and weight has increased as high as 198.  She feels this is affecting her energy levels and causing bilateral knee pain.  She was referred by: PCP  When asked what else they would like to accomplish? She states: Adopt healthier eating patterns, Improve existing medical conditions, and Improve quality of life  When asked how has your weight affected you? She states: Contributed to orthopedic problems or mobility issues  Some associated conditions: None  Contributing factors: Disruption of circadian rhythm and Stress  Weight promoting medications identified: None  Current nutrition plan: Portion control / smart choices  Current level of physical activity: NEAT  Current or previous pharmacotherapy: None  Response to medication: Never tried medications   Past medical history includes:   Past Medical History:  Diagnosis Date   Allergy    Anxiety    B12 deficiency    Chronic headaches    Common migraine with intractable migraine 08/25/2016   Depression    GAD (generalized anxiety disorder)    Migraine    Obesity      Objective:   BP 115/83   Pulse 77   Temp 98.2 F (36.8 C)   Ht 5\' 4"  (1.626 m)   Wt 188 lb (85.3 kg)   SpO2 100%   BMI 32.27 kg/m  She was weighed on the bioimpedance scale: Body mass index is 32.27 kg/m.  Peak Weight: 198, Body Fat%: 39, Visceral Fat Rating: 9, Weight trend over the last 12 months: Decreasing  General:  Alert, oriented and cooperative. Patient  is in no acute distress.  Respiratory: Normal respiratory effort, no problems with respiration noted  Extremities: Normal range of motion.    Mental Status: Normal mood and affect. Normal behavior. Normal judgment and thought content.   DIAGNOSTIC DATA REVIEWED:  BMET    Component Value Date/Time   NA 137 11/08/2022 1133   K 3.6 11/08/2022 1133   CL 105 11/08/2022 1133   CO2 25 11/08/2022 1133   GLUCOSE 85 11/08/2022 1133   BUN 8 11/08/2022 1133   CREATININE 0.75 11/08/2022 1133   CREATININE 0.84 10/12/2022 1013   CALCIUM 9.4 11/08/2022 1133   GFRNONAA >60 03/01/2022 0506   GFRAA >60 12/29/2016 0024   No results found for: "HGBA1C" No results found for: "INSULIN" CBC    Component Value Date/Time   WBC 8.7 11/08/2022 1133   RBC 5.20 (H) 11/08/2022 1133   HGB 14.5 11/08/2022 1133   HCT 44.7 11/08/2022 1133   PLT 231.0 11/08/2022 1133   MCV 85.9 11/08/2022 1133   MCH 28.6 10/12/2022 1013   MCHC 32.5 11/08/2022 1133   RDW 12.8 11/08/2022 1133   Iron/TIBC/Ferritin/ %Sat No results found for: "IRON", "TIBC", "FERRITIN", "IRONPCTSAT" Lipid Panel     Component Value Date/Time   CHOL 145 10/12/2022 1013   TRIG 53 10/12/2022 1013   HDL 57 10/12/2022 1013   CHOLHDL 2.5 10/12/2022 1013   LDLCALC 75 10/12/2022 1013   Hepatic Function Panel  Component Value Date/Time   PROT 7.9 11/08/2022 1133   ALBUMIN 4.6 11/08/2022 1133   AST 14 11/08/2022 1133   ALT 9 11/08/2022 1133   ALKPHOS 47 11/08/2022 1133   BILITOT 0.7 11/08/2022 1133      Component Value Date/Time   TSH 2.35 10/12/2022 1013     Assessment and Plan:  1. Chronic pain of both knees She feels that her weight is contributing to bilateral knee pain.  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  This may be a biomechanical complication of her weight.  Pain may improve with losing 10% of body weight.  2. Class 1 obesity with serious comorbidity and body mass index (BMI) of 32.0 to 32.9 in adult,  unspecified obesity type We reviewed weight, biometrics, associated medical conditions and contributing factors with patient. She would benefit from weight loss therapy via a modified calorie, low-carb, high-protein nutritional plan tailored to their REE (resting energy expenditure) which will be determined by indirect calorimetry.  We will also assess for cardiometabolic risk and nutritional derangements via fasting serologies at her next appointment.       Obesity Treatment / Action Plan:  Patient will work on garnering support from family and friends to begin weight loss journey. Will work on eliminating or reducing the presence of highly palatable, calorie dense foods in the home. Will complete provided nutritional and psychosocial assessment questionnaire before the next appointment. Will be scheduled for indirect calorimetry to determine resting energy expenditure in a fasting state.  This will allow [place] to create a reduced calorie, high-protein meal plan to promote loss of fat mass while preserving muscle mass. Counseled on the health benefits of losing 5%-15% of total body weight. Will work on improving sleep hygiene and trying to obtain at least 7 hours of sleep. Was counseled on nutritional approaches to weight loss and benefits of complex carbs and high quality protein as part of nutritional weight management. Was counseled on pharmacotherapy and role as an adjunct in weight management.   Obesity Education Performed Today:  She was weighed on the bioimpedance scale and results were discussed and documented in the synopsis.  We discussed obesity as a disease and the importance of a more detailed evaluation of all the factors contributing to the disease.  We discussed the importance of long term lifestyle changes which include nutrition, exercise and behavioral modifications as well as the importance of customizing this to her specific health and social needs.  We discussed the  benefits of reaching a healthier weight to alleviate the symptoms of existing conditions and reduce the risks of the biomechanical, metabolic and psychological effects of obesity.  Sylvia [name] appears to be in the action stage of change and states they are ready to start intensive lifestyle modifications and behavioral modifications.  30 minutes was spent today on this visit including the above counseling, pre-visit chart review, and post-visit documentation.  Reviewed by clinician on day of visit: allergies, medications, problem list, medical history, surgical history, family history, social history, and previous encounter notes.    I have reviewed the above documentation for accuracy and completeness, and I agree with the above.  Thomes Dinning, MD

## 2023-01-10 ENCOUNTER — Ambulatory Visit: Payer: No Typology Code available for payment source | Admitting: Adult Health

## 2023-01-14 ENCOUNTER — Encounter: Payer: Self-pay | Admitting: Internal Medicine

## 2023-01-14 ENCOUNTER — Encounter: Payer: Self-pay | Admitting: Family Medicine

## 2023-01-14 DIAGNOSIS — G8929 Other chronic pain: Secondary | ICD-10-CM

## 2023-01-14 DIAGNOSIS — E6609 Other obesity due to excess calories: Secondary | ICD-10-CM

## 2023-01-19 ENCOUNTER — Other Ambulatory Visit: Payer: Self-pay | Admitting: Family Medicine

## 2023-01-19 DIAGNOSIS — G8929 Other chronic pain: Secondary | ICD-10-CM

## 2023-01-19 DIAGNOSIS — E6609 Other obesity due to excess calories: Secondary | ICD-10-CM

## 2023-01-19 MED ORDER — ZEPBOUND 2.5 MG/0.5ML ~~LOC~~ SOAJ: SUBCUTANEOUS | 0 refills | Status: DC

## 2023-01-27 ENCOUNTER — Other Ambulatory Visit: Payer: Self-pay | Admitting: Family Medicine

## 2023-01-27 DIAGNOSIS — G8929 Other chronic pain: Secondary | ICD-10-CM

## 2023-01-27 DIAGNOSIS — E6609 Other obesity due to excess calories: Secondary | ICD-10-CM

## 2023-02-02 ENCOUNTER — Telehealth: Payer: Self-pay

## 2023-02-02 NOTE — Telephone Encounter (Signed)
PA-Zepbound Sylvia [name] ([id number]) PA Case ID #: [id number] Rx #: [id number]   The request has been approved. The authorization is effective from 01/26/2023 to 07/25/2023,as long as the member is enrolled in their current health plan. The request was approved as submitted. This request was approved for a quantity of 57m (four pens) per 28 days.Additional authorizations have been entered for the following:Zepbound 5 mg/0.5 mL, allowing 2 mL (four pens) per 28 days;please reference authorization [id number];Zepbound 7.5 mg/0.5 mL, allowing 2 mL (four pens) per 28 days;please reference authorization [id number];Zepbound 10 mg/0.5 mL, allowing 2 mL (four pens) per 28 days;please reference authorization [id number];Zepbound 12.5 mg/0.5 mL, allowing 2 mL (four pens) per 28 days;please reference authorization [id number];Zepbound 15 mg/0.5 mL, allowing 2 mL (four pens) per 28 days;please reference authorization [id number] effective 01/26/2023 through 07/25/2023. A written notification letter will follow with additional details. Authorization Expiration Date: 07/24/2023 Drug Zepbound 2.5MG/0.5ML pen-injectors ePA cloud logo Form [employer] ePA Form 2017 NCP[employer] Original Claim Info [id number]

## 2023-02-08 ENCOUNTER — Ambulatory Visit (INDEPENDENT_AMBULATORY_CARE_PROVIDER_SITE_OTHER): Payer: 59 | Admitting: Internal Medicine

## 2023-02-18 ENCOUNTER — Telehealth: Payer: Self-pay

## 2023-02-18 NOTE — Telephone Encounter (Signed)
Pt called today requested for increase on her Zepbound to '5mg'$ ?  Pls advice

## 2023-02-22 ENCOUNTER — Ambulatory Visit (INDEPENDENT_AMBULATORY_CARE_PROVIDER_SITE_OTHER): Payer: 59 | Admitting: Internal Medicine

## 2023-02-23 ENCOUNTER — Encounter (INDEPENDENT_AMBULATORY_CARE_PROVIDER_SITE_OTHER): Payer: Self-pay | Admitting: Internal Medicine

## 2023-02-23 ENCOUNTER — Ambulatory Visit (INDEPENDENT_AMBULATORY_CARE_PROVIDER_SITE_OTHER): Payer: 59 | Admitting: Internal Medicine

## 2023-02-23 VITALS — BP 112/73 | HR 84 | Temp 98.2°F | Ht 64.0 in | Wt 184.0 lb

## 2023-02-23 DIAGNOSIS — E538 Deficiency of other specified B group vitamins: Secondary | ICD-10-CM | POA: Diagnosis not present

## 2023-02-23 DIAGNOSIS — R5383 Other fatigue: Secondary | ICD-10-CM | POA: Diagnosis not present

## 2023-02-23 DIAGNOSIS — Z72821 Inadequate sleep hygiene: Secondary | ICD-10-CM | POA: Diagnosis not present

## 2023-02-23 DIAGNOSIS — E669 Obesity, unspecified: Secondary | ICD-10-CM | POA: Diagnosis not present

## 2023-02-23 DIAGNOSIS — R0602 Shortness of breath: Secondary | ICD-10-CM

## 2023-02-23 DIAGNOSIS — I10 Essential (primary) hypertension: Secondary | ICD-10-CM

## 2023-02-23 DIAGNOSIS — Z6832 Body mass index (BMI) 32.0-32.9, adult: Secondary | ICD-10-CM | POA: Diagnosis not present

## 2023-02-23 DIAGNOSIS — Z1331 Encounter for screening for depression: Secondary | ICD-10-CM | POA: Diagnosis not present

## 2023-02-23 NOTE — Assessment & Plan Note (Signed)
Patient is sleeping less than 5 hours at night.  We emphasized the importance of getting at least 7 hours as an adequate sleep may result in weight gain.  We will work on sleep hygiene at the next office visit.

## 2023-02-23 NOTE — Assessment & Plan Note (Signed)
She has a history of B12 deficiency and was receiving parenteral B12.  We will check methylmalonic acid to make sure does not have B12 deficiency.

## 2023-02-23 NOTE — Assessment & Plan Note (Signed)
Blood pressure is well-controlled.  She is currently on amlodipine 2.5 mg a day without any adverse effects.  She will monitor for orthostasis while losing weight.  Continue current regimen.  Reviewed most recent renal parameters and they are within normal limits

## 2023-02-23 NOTE — Progress Notes (Unsigned)
Chief Complaint:   OBESITY Sylvia [name] (MR# [id number]) is a 47 y.o. female who presents for evaluation and treatment of obesity and related comorbidities. Current BMI is Body mass index is 31.58 kg/m. Sylvia [name] has been struggling with her weight for many years and has been unsuccessful in either losing weight, maintaining weight loss, or reaching her healthy weight goal.  Sylvia [name] is currently in the action stage of change and ready to dedicate time achieving and maintaining a healthier weight. Sylvia [name] is interested in becoming our patient and working on intensive lifestyle modifications including (but not limited to) diet and exercise for weight loss.  Sylvia [name]'s habits were reviewed today and are as follows: Her family eats meals together, she thinks her family will eat healthier with her, her desired weight loss is 34 lbs, she started gaining weight in 2018, her heaviest weight ever was 198 pounds, she has significant food cravings issues, she skips meals frequently, she is frequently drinking liquids with calories, she frequently eats larger portions than normal, and she struggles with emotional eating.  Depression Screen Larenda's Food and Mood (modified PHQ-9) score was 12.  Subjective:   1. Other fatigue Sylvia [name] admits to daytime somnolence and admits to waking up still tired. Patient has a history of symptoms of daytime fatigue, morning fatigue, and morning headache. Sylvia [name] generally gets 4 or 5 hours of sleep per night, and states that she has generally restful sleep. Snoring is present. Apneic episodes are not present. Epworth Sleepiness Score is 8.   2. SOB (shortness of breath) on exertion Sylvia [name] notes increasing shortness of breath with exercising and seems to be worsening over time with weight gain. She notes getting out of breath sooner with activity than she used to. This has gotten worse recently. Sylvia [name] denies shortness of breath at rest or  orthopnea.  3. B12 deficiency She has a history of B12 deficiency and was receiving parenteral B12.   4. Inadequate sleep hygiene Patient is sleeping less than 5 hours at night.   5. Primary hypertension Patient takes amlodipine 2.5 mg daily.  Assessment/Plan:   1. Other fatigue Sylvia [name] does feel that her weight is causing her energy to be lower than it should be. Fatigue may be related to obesity, depression or many other causes. Labs will be ordered, and in the meanwhile, Sylvia [name] will focus on self care including making healthy food choices, increasing physical activity and focusing on stress reduction.  2. SOB (shortness of breath) on exertion Stephana does feel that she gets out of breath more easily that she used to when she exercises. Sylvia [name] shortness of breath appears to be obesity related and exercise induced. She has agreed to work on weight loss and gradually increase exercise to treat her exercise induced shortness of breath. Will continue to monitor closely.  3. B12 deficiency We will check methylmalonic acid to make sure does not have B12 deficiency.  Lab/Orders: - Methylmalonic Acid  4. Inadequate sleep hygiene We emphasized the importance of getting at least 7 hours as an adequate sleep may result in weight gain.  We will work on sleep hygiene at the next office visit.  5. Primary hypertension Sylvia [name] is working on healthy weight loss and exercise to improve blood pressure control. We will watch for signs of hypotension as she continues her lifestyle modifications.  6. Depression screen Sylvia [name] had a positive depression screening. Depression is commonly associated with obesity and often results in emotional eating behaviors. We will monitor  this closely and work on CBT to help improve the non-hunger eating patterns. Referral to Psychology may be required if no improvement is seen as she continues in our clinic.  7. Class 1 obesity with serious comorbidity and body  mass index (BMI) of 32.0 to 32.9 in adult, unspecified obesity type Lab/Orders: - Glucose, fasting; Future - VITAMIN D 25 Hydroxy (Vit-D Deficiency, Fractures) - Insulin, random - Hemoglobin A1c  Sylvia [name] is currently in the action stage of change and her goal is to continue with weight loss efforts. I recommend Sylvia [name] begin the structured treatment plan as follows:  She has agreed to the Category 2 Plan.  Exercise goals: All adults should avoid inactivity. Some physical activity is better than none, and adults who participate in any amount of physical activity gain some health benefits.   Behavioral modification strategies: increasing lean protein intake, decreasing simple carbohydrates, increasing vegetables, increasing water intake, decreasing liquid calories, increasing high fiber foods, no skipping meals, meal planning and cooking strategies, keeping healthy foods in the home, better snacking choices, avoiding temptations, and planning for success.  She was informed of the importance of frequent follow-up visits to maximize her success with intensive lifestyle modifications for her multiple health conditions. She was informed we would discuss her lab results at her next visit unless there is a critical issue that needs to be addressed sooner. Sylvia [name] agreed to keep her next visit at the agreed upon time to discuss these results.  Objective:   Blood pressure 112/73, pulse 84, temperature 98.2 F (36.8 C), height 5\' 4"  (1.626 m), weight 184 lb (83.5 kg), SpO2 100 %. Body mass index is 31.58 kg/m.  EKG: Normal sinus rhythm, rate 72.  Indirect Calorimeter completed today shows a VO2 of 203 and a REE of 1397.  Her calculated basal metabolic rate is 8250 thus her basal metabolic rate is worse than expected.  General: Cooperative, alert, well developed, in no acute distress. HEENT: Conjunctivae and lids unremarkable. Cardiovascular: Regular rhythm.  Lungs: Normal work of  breathing. Neurologic: No focal deficits.   Lab Results  Component Value Date   CREATININE 0.75 11/08/2022   BUN 8 11/08/2022   NA 137 11/08/2022   K 3.6 11/08/2022   CL 105 11/08/2022   CO2 25 11/08/2022   Lab Results  Component Value Date   ALT 9 11/08/2022   AST 14 11/08/2022   ALKPHOS 47 11/08/2022   BILITOT 0.7 11/08/2022   Lab Results  Component Value Date   HGBA1C 5.3 02/23/2023   Lab Results  Component Value Date   INSULIN 3.7 02/23/2023   Lab Results  Component Value Date   TSH 2.35 10/12/2022   Lab Results  Component Value Date   CHOL 145 10/12/2022   HDL 57 10/12/2022   LDLCALC 75 10/12/2022   TRIG 53 10/12/2022   CHOLHDL 2.5 10/12/2022   Lab Results  Component Value Date   WBC 8.7 11/08/2022   HGB 14.5 11/08/2022   HCT 44.7 11/08/2022   MCV 85.9 11/08/2022   PLT 231.0 11/08/2022   Attestation Statements:   Reviewed by clinician on day of visit: allergies, medications, problem list, medical history, surgical history, family history, social history, and previous encounter notes.  Time spent on visit including pre-visit chart review and post-visit charting and care was 40 minutes.   I, Kathlene November, BS, CMA, am acting as transcriptionist for Thomes Dinning, MD.  I have reviewed the above documentation for accuracy and completeness, and I agree with  the above. -Thomes Dinning, MD

## 2023-02-25 ENCOUNTER — Other Ambulatory Visit: Payer: Self-pay

## 2023-02-25 ENCOUNTER — Other Ambulatory Visit (HOSPITAL_COMMUNITY): Payer: Self-pay

## 2023-02-25 DIAGNOSIS — G8929 Other chronic pain: Secondary | ICD-10-CM

## 2023-02-25 DIAGNOSIS — E6609 Other obesity due to excess calories: Secondary | ICD-10-CM

## 2023-02-25 MED ORDER — ZEPBOUND 5 MG/0.5ML ~~LOC~~ SOAJ
5.0000 mg | SUBCUTANEOUS | 0 refills | Status: AC
  Filled 2023-02-25 – 2023-10-26 (×2): qty 2, 28d supply, fill #0

## 2023-02-25 MED ORDER — ZEPBOUND 2.5 MG/0.5ML ~~LOC~~ SOAJ
SUBCUTANEOUS | 0 refills | Status: DC
  Filled 2023-02-25: qty 2, 28d supply, fill #0

## 2023-02-28 ENCOUNTER — Other Ambulatory Visit: Payer: Self-pay

## 2023-02-28 ENCOUNTER — Other Ambulatory Visit (HOSPITAL_COMMUNITY): Payer: Self-pay

## 2023-02-28 LAB — VITAMIN D 25 HYDROXY (VIT D DEFICIENCY, FRACTURES): Vit D, 25-Hydroxy: 18.8 ng/mL — ABNORMAL LOW (ref 30.0–100.0)

## 2023-02-28 LAB — METHYLMALONIC ACID, SERUM: Methylmalonic Acid: 91 nmol/L (ref 0–378)

## 2023-02-28 LAB — HEMOGLOBIN A1C
Est. average glucose Bld gHb Est-mCnc: 105 mg/dL
Hgb A1c MFr Bld: 5.3 % (ref 4.8–5.6)

## 2023-02-28 LAB — INSULIN, RANDOM: INSULIN: 3.7 u[IU]/mL (ref 2.6–24.9)

## 2023-03-03 ENCOUNTER — Other Ambulatory Visit (HOSPITAL_COMMUNITY): Payer: Self-pay

## 2023-03-07 ENCOUNTER — Other Ambulatory Visit (HOSPITAL_COMMUNITY): Payer: Self-pay

## 2023-03-09 ENCOUNTER — Ambulatory Visit (INDEPENDENT_AMBULATORY_CARE_PROVIDER_SITE_OTHER): Payer: 59 | Admitting: Internal Medicine

## 2023-03-11 ENCOUNTER — Other Ambulatory Visit (HOSPITAL_COMMUNITY): Payer: Self-pay

## 2023-03-21 ENCOUNTER — Ambulatory Visit (HOSPITAL_BASED_OUTPATIENT_CLINIC_OR_DEPARTMENT_OTHER): Payer: 59 | Admitting: Physical Therapy

## 2023-07-01 IMAGING — CT CT HEART MORP W/ CTA COR W/ SCORE W/ CA W/CM &/OR W/O CM
4 of 7 series · 8 of 20 positions shown, 9 images · IV contrast (APPLIED)
Comparison: Chest radiograph March 01, 2022.
COMPARISON: Chest radiograph March 01, 2022.

Addendum:
EXAM:
OVER-READ INTERPRETATION  CT CHEST

The following report is an over-read performed by radiologist Dr.
Alexandru Outlaw [REDACTED] on 03/18/2022. This
over-read does not include interpretation of cardiac or coronary
anatomy or pathology. The coronary calcium score/coronary CTA
interpretation by the cardiologist is attached.
CLINICAL DATA: 45 year old with chest pain.
Cardiac/Coronary  CTA
TECHNIQUE: The patient was scanned on a [employer] Force scanner.

[Series 6: ts diast sharp · axial · 0.39mm/px · z∈[-332,-291]mm · 2 of 306 slices shown]
[im 102/306  lung]
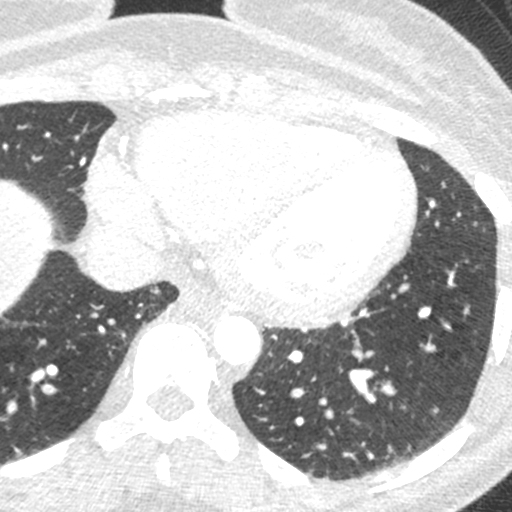
[im 204/306  lung]
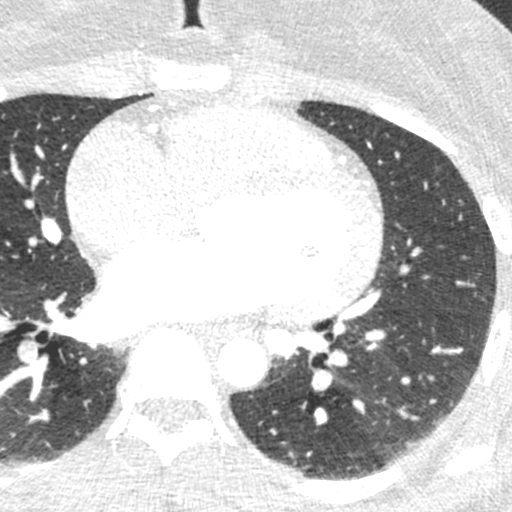

[Series 7: best diast · axial · 0.39mm/px · z∈[-332,-291]mm · 2 of 306 slices shown, 3 images]
[im 102/306  vessel]
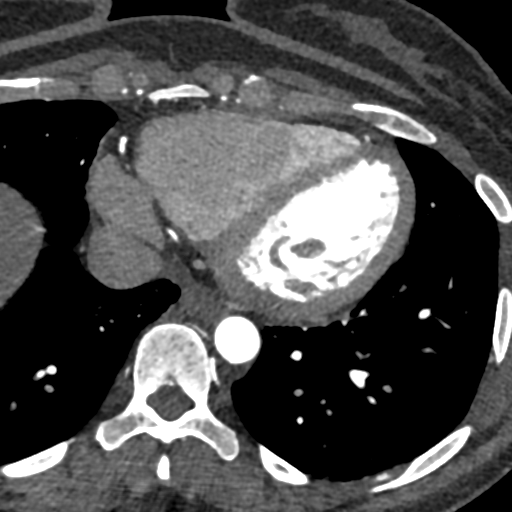
[im 102/306  lung]
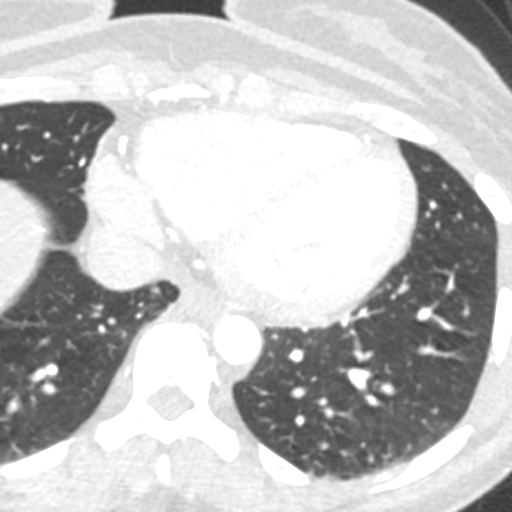
[im 204/306  vessel]
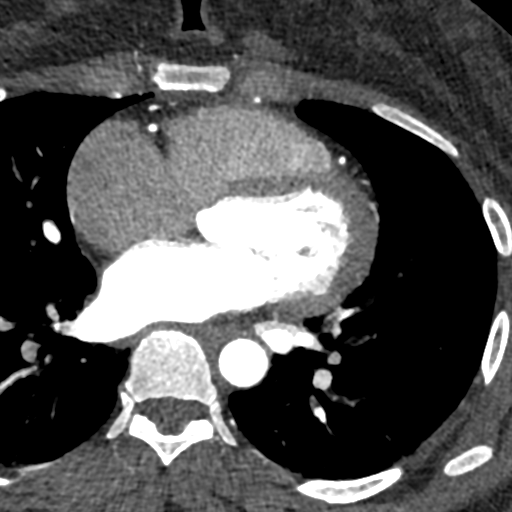

[Series 8: best syst · axial · 0.39mm/px · z∈[-332,-291]mm · 2 of 306 slices shown]
[im 102/306  vessel]
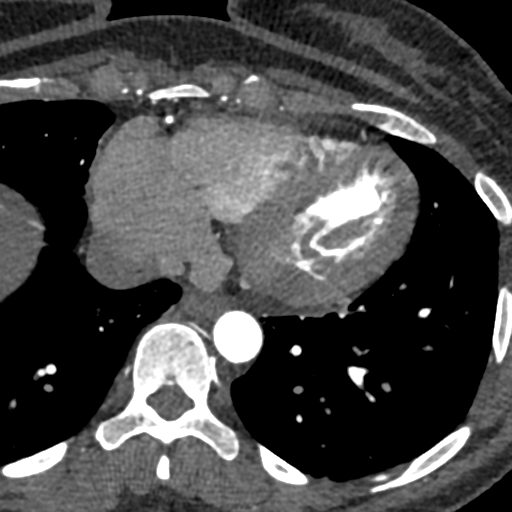
[im 204/306  vessel]
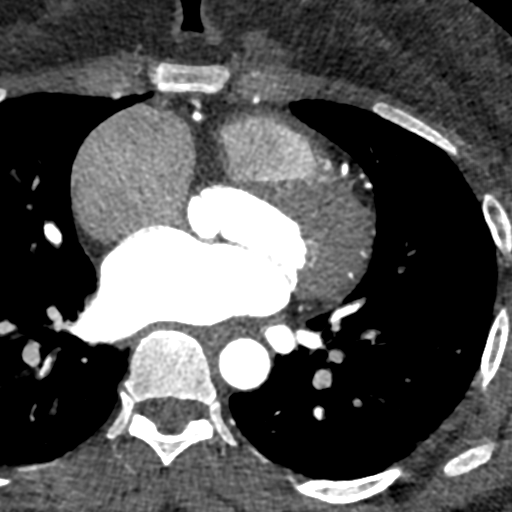

[Series 9: ts syst sharp · axial · 0.39mm/px · z∈[-332,-291]mm · 2 of 306 slices shown]
[im 102/306  lung]
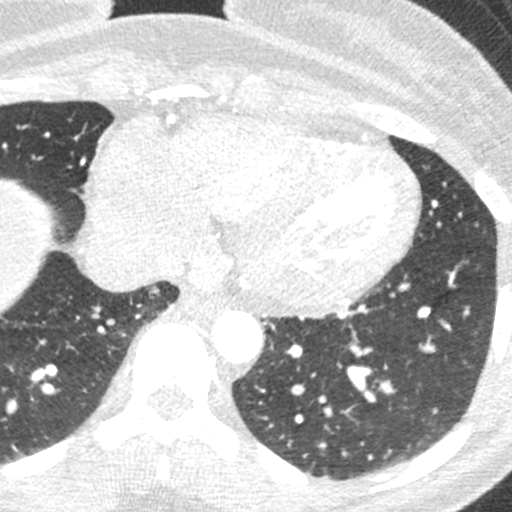
[im 204/306  lung]
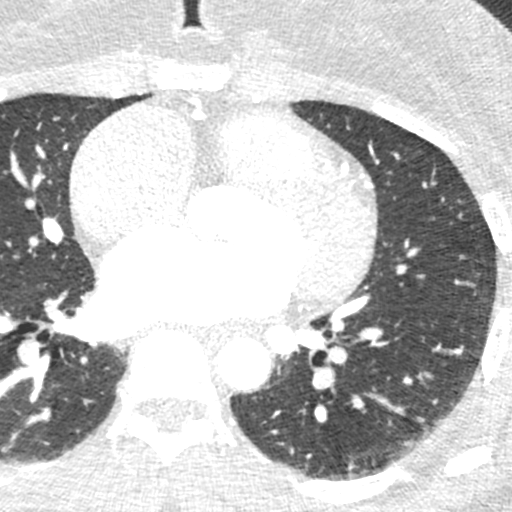

[8 of 20 positions shown; findings below may reference images not displayed]

FINDINGS: Vascular: No significant noncardiac vascular finding.

Mediastinum/Nodes: Within the acquired field-of-view there are no
pathologically enlarged mediastinal or hilar lymph nodes and the
appearance of the esophagus is grossly unremarkable.

Lungs/Pleura: No suspicious pulmonary nodules or masses and no
pleural effusion or pneumothorax within the acquired field-of-view.

Upper Abdomen: No acute abnormality.

Musculoskeletal: No acute osseous abnormality.
IMPRESSION: No significant abnormal extracardiac findings in the acquired
field-of-view.
FINDINGS: A 100 kV prospective scan was triggered in the descending thoracic
aorta at 111 HU's. Axial non-contrast 3 mm slices were carried out
through the heart. The data set was analyzed on a dedicated work
station and scored using the Agatson method. Gantry rotation speed
was 250 msecs and collimation was .6 mm. 0.8 mg of sl NTG was given.
The 3D data set was reconstructed in 5% intervals of the 67-82 % of
the R-R cycle. Diastolic phases were analyzed on a dedicated work
station using MPR, MIP and VRT modes. The patient received 80 cc of
contrast.

Image quality: Good. Misregistration artifact in distal LAD segment.
Does not affect read.

Aorta:  Normal size.  No calcifications.  No dissection.

Aortic Valve:  Trileaflet.  No calcifications.

Coronary Arteries:  Normal coronary origin.  Right dominance.

RCA is a large dominant artery that gives rise to PDA and PLA. There
is no plaque.

Left main is a large artery that gives rise to LAD and LCX arteries.

LAD is a large vessel that has no plaque.  2 diagonal branches.

LCX is a non-dominant artery.  There is no plaque.

Other findings:

Normal pulmonary vein drainage into the left atrium.

Normal left atrial appendage without a thrombus.

Normal size of the pulmonary artery.

Please see radiology report for non cardiac findings.
IMPRESSION: 1. Coronary calcium score of 0. This was 0 percentile for age and
sex matched control.

2. Normal coronary origin with right dominance.

3. No evidence of CAD.

*** End of Addendum ***
EXAM:
OVER-READ INTERPRETATION  CT CHEST

The following report is an over-read performed by radiologist Dr.
Alexandru Outlaw [REDACTED] on 03/18/2022. This
over-read does not include interpretation of cardiac or coronary
anatomy or pathology. The coronary calcium score/coronary CTA
interpretation by the cardiologist is attached.
FINDINGS: Vascular: No significant noncardiac vascular finding.

Mediastinum/Nodes: Within the acquired field-of-view there are no
pathologically enlarged mediastinal or hilar lymph nodes and the
appearance of the esophagus is grossly unremarkable.

Lungs/Pleura: No suspicious pulmonary nodules or masses and no
pleural effusion or pneumothorax within the acquired field-of-view.

Upper Abdomen: No acute abnormality.

Musculoskeletal: No acute osseous abnormality.
IMPRESSION: No significant abnormal extracardiac findings in the acquired
field-of-view.

## 2023-07-15 NOTE — Telephone Encounter (Signed)
PA-Zepound has been sent to plan 07/15/23, waiting for insurance to approve.

## 2023-08-30 ENCOUNTER — Other Ambulatory Visit (HOSPITAL_COMMUNITY): Payer: Self-pay

## 2023-09-19 ENCOUNTER — Encounter: Payer: Self-pay | Admitting: Adult Health

## 2023-09-19 ENCOUNTER — Ambulatory Visit (INDEPENDENT_AMBULATORY_CARE_PROVIDER_SITE_OTHER): Payer: No Typology Code available for payment source | Admitting: Adult Health

## 2023-09-19 DIAGNOSIS — F4312 Post-traumatic stress disorder, chronic: Secondary | ICD-10-CM

## 2023-09-19 DIAGNOSIS — F332 Major depressive disorder, recurrent severe without psychotic features: Secondary | ICD-10-CM

## 2023-09-19 DIAGNOSIS — F4010 Social phobia, unspecified: Secondary | ICD-10-CM | POA: Diagnosis not present

## 2023-09-19 DIAGNOSIS — F411 Generalized anxiety disorder: Secondary | ICD-10-CM | POA: Diagnosis not present

## 2023-09-19 MED ORDER — ESCITALOPRAM OXALATE 20 MG PO TABS: 20.0000 mg | ORAL_TABLET | Freq: Every day | ORAL | 1 refills | Status: AC

## 2023-09-19 MED ORDER — ALPRAZOLAM 0.5 MG PO TABS: 0.5000 mg | ORAL_TABLET | Freq: Three times a day (TID) | ORAL | 2 refills | Status: AC | PRN

## 2023-09-19 MED ORDER — ESZOPICLONE 2 MG PO TABS: 2.0000 mg | ORAL_TABLET | Freq: Every evening | ORAL | 2 refills | Status: AC | PRN

## 2023-09-19 NOTE — Progress Notes (Addendum)
Sylvia [name] [id number] 21-Sep-1976 47 y.o.  Subjective:   Patient ID:  Sylvia [name] is a 47 y.o. (DOB [birth year]) female.  Chief Complaint: No chief complaint on file.   HPI Sylvia [name] presents to the office today for follow-up of MDD, GAD, PTSD and social phobia.  Previously seen by Dr. Marlyne Beards.   Describes mood today as "not too good". Pleasant. Tearful at times. Mood symptoms - reports depression and anxiety. Reports feeling irritable at times. Reports increased panic attacks. Reports worry, rumination and over thinking. Reports a lot of self doubt. Reports some conflict with youngest daughter. Reports mood fluctuations. Stating "I haven't been doing to good, but I'm holding it in the road". Feels like medications are helpful. Varying interest and motivation. Taking medications as prescribed.  Energy levels variable. Active, does not have a regular exercise routine.    Enjoys some usual interests and activities. Married, but separated. Has 4 children - grandchild. Spending time with family. Appetite varies. Weight stable. Reports difficulties with sleep. Averages 4 to 5 hours - "I can't stop over thinking.  Focus and concentration "difficulties". Completing tasks. Managing aspects of household. 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 Denies SI or HI.  Denies AH or VH. Denies self harm.  Denies substance use.   Previous medication trials:  Ambien, Xanax, Zoloft-tremors   GAD-7    Flowsheet Row Office Visit from 10/21/2022 in [facility] at [facility] Visit from 10/11/2022 in [facility]  Total GAD-7 Score 0 13      PHQ2-9    Flowsheet Row Office Visit from 10/21/2022 in [facility] at [facility] Visit from 10/11/2022 in [facility] Office Visit from 04/24/2018 in [facility]  PHQ-2 Total Score 0 2 3  PHQ-9 Total Score 0 11 10      Flowsheet Row ED from 03/01/2022 in [facility] Department at [facility]  C-SSRS RISK CATEGORY No Risk        Review of Systems:  Review of Systems  Musculoskeletal:  Negative for gait problem.  Neurological:  Negative for tremors.  Psychiatric/Behavioral:         Please refer to HPI    Medications: I have reviewed the patient's current medications.  Current Outpatient Medications  Medication Sig Dispense Refill   ALPRAZolam (XANAX) 0.5 MG tablet Take 1 tablet (0.5 mg total) by mouth 3 (three) times daily as needed for anxiety. 90 tablet 2   amLODipine (NORVASC) 2.5 MG tablet Take 1 tablet (2.5 mg total) by mouth at bedtime. 90 tablet 2   escitalopram (LEXAPRO) 20 MG tablet Take 1 tablet (20 mg total) by mouth daily. 30 tablet 2   eszopiclone (LUNESTA) 2 MG TABS tablet Take 1 tablet (2 mg total) by mouth at bedtime as needed for sleep. Take immediately before bedtime 30 tablet 2   famotidine (PEPCID) 40 MG tablet Take 1 tablet (40 mg total) by mouth at bedtime. 30 tablet 1   omeprazole (PRILOSEC) 40 MG capsule Take 1 capsule (40 mg total) by mouth 2 (two) times daily. 90 capsule 3   tirzepatide (ZEPBOUND) 5 MG/0.5ML Pen Inject 5 mg into the skin once a week. 2 mL 0   No current facility-administered medications for this visit.    Medication Side Effects: None  Allergies:  Allergies  Allergen Reactions   Coconut Flavor  Anaphylaxis   Topamax [Topiramate]     Felt Suicidal   Latex Hives   Penicillins Hives    Past Medical History:  Diagnosis Date   Allergy    Anxiety    B12 deficiency    B12 deficiency    Back pain    Chest pain    Chronic headaches    Common migraine with intractable migraine 08/25/2016   Constipation    Depression    Edema of both lower extremities    GAD (generalized anxiety disorder)    Migraine    Obesity     Past Medical History, Surgical  history, Social history, and Family history were reviewed and updated as appropriate.   Please see review of systems for further details on the patient's review from today.   Objective:   Physical Exam:  There were no vitals taken for this visit.  Physical Exam Constitutional:      General: She is not in acute distress. Musculoskeletal:        General: No deformity.  Neurological:     Mental Status: She is alert and oriented to person, place, and time.     Coordination: Coordination normal.  Psychiatric:        Attention and Perception: Attention and perception normal. She does not perceive auditory or visual hallucinations.        Mood and Affect: Mood normal. Mood is not anxious or depressed. Affect is not labile, blunt, angry or inappropriate.        Speech: Speech normal.        Behavior: Behavior normal.        Thought Content: Thought content normal. Thought content is not paranoid or delusional. Thought content does not include homicidal or suicidal ideation. Thought content does not include homicidal or suicidal plan.        Cognition and Memory: Cognition and memory normal.        Judgment: Judgment normal.     Comments: Insight intact     Lab Review:     Component Value Date/Time   NA 137 11/08/2022 1133   K 3.6 11/08/2022 1133   CL 105 11/08/2022 1133   CO2 25 11/08/2022 1133   GLUCOSE 85 11/08/2022 1133   BUN 8 11/08/2022 1133   CREATININE 0.75 11/08/2022 1133   CREATININE 0.84 10/12/2022 1013   CALCIUM 9.4 11/08/2022 1133   PROT 7.9 11/08/2022 1133   ALBUMIN 4.6 11/08/2022 1133   AST 14 11/08/2022 1133   ALT 9 11/08/2022 1133   ALKPHOS 47 11/08/2022 1133   BILITOT 0.7 11/08/2022 1133   GFRNONAA >60 03/01/2022 0506   GFRAA >60 12/29/2016 0024       Component Value Date/Time   WBC 8.7 11/08/2022 1133   RBC 5.20 (H) 11/08/2022 1133   HGB 14.5 11/08/2022 1133   HCT 44.7 11/08/2022 1133   PLT 231.0 11/08/2022 1133   MCV 85.9 11/08/2022 1133   MCH  28.6 10/12/2022 1013   MCHC 32.5 11/08/2022 1133   RDW 12.8 11/08/2022 1133   LYMPHSABS 1.9 11/08/2022 1133   MONOABS 0.4 11/08/2022 1133   EOSABS 0.1 11/08/2022 1133   BASOSABS 0.0 11/08/2022 1133    No results found for: "POCLITH", "LITHIUM"   No results found for: "PHENYTOIN", "PHENOBARB", "VALPROATE", "CBMZ"   .res Assessment: Plan:    Plan:  PDMP reviewed  Lexapro 20mg  daily  Xanax 0.5mg  TID  Lunesta 2mg  at hs  Complete FMLA - will continue intermittent leave.  RTC  6 weeks  Patient advised to contact office with any questions, adverse effects, or acute worsening in signs and symptoms.   Discussed potential benefits, risk, and side effects of benzodiazepines to include potential risk of tolerance and dependence, as well as possible drowsiness.  Advised patient not to drive if experiencing drowsiness and to take lowest possible effective dose to minimize risk of dependence and tolerance.   There are no diagnoses linked to this encounter.   Please see After Visit Summary for patient specific instructions.  Future Appointments  Date Time Provider Department Center  09/19/2023 10:20 AM Auriel Kist, Thereasa Solo, NP [facility] None    No orders of the defined types were placed in this encounter.   -------------------------------

## 2023-09-28 DIAGNOSIS — Z0289 Encounter for other administrative examinations: Secondary | ICD-10-CM

## 2023-09-30 ENCOUNTER — Telehealth: Payer: Self-pay

## 2023-09-30 NOTE — Telephone Encounter (Signed)
Pt's renewal for intermittent FMLA was completed and faxed to [employer]

## 2023-10-27 ENCOUNTER — Other Ambulatory Visit: Payer: Self-pay

## 2024-03-16 ENCOUNTER — Telehealth: Payer: No Typology Code available for payment source | Admitting: Adult Health
